# Patient Record
Sex: Female | Born: 1980 | Race: White | Hispanic: No | Marital: Married | Smoking: Never smoker
Health system: Southern US, Community
[De-identification: ages and names within clinical notes are randomized; demographics above are authoritative.]

## PROBLEM LIST (undated history)

## (undated) ENCOUNTER — Inpatient Hospital Stay (HOSPITAL_COMMUNITY): Payer: Self-pay

## (undated) DIAGNOSIS — F419 Anxiety disorder, unspecified: Secondary | ICD-10-CM

## (undated) DIAGNOSIS — K649 Unspecified hemorrhoids: Secondary | ICD-10-CM

## (undated) HISTORY — PX: CHOLECYSTECTOMY: SHX55

---

## 1999-01-02 ENCOUNTER — Emergency Department (HOSPITAL_COMMUNITY): Admission: EM | Admit: 1999-01-02 | Discharge: 1999-01-02 | Payer: Self-pay | Admitting: Emergency Medicine

## 1999-01-02 ENCOUNTER — Encounter: Payer: Self-pay | Admitting: Emergency Medicine

## 1999-08-24 ENCOUNTER — Encounter: Payer: Self-pay | Admitting: Emergency Medicine

## 1999-08-24 ENCOUNTER — Emergency Department (HOSPITAL_COMMUNITY): Admission: EM | Admit: 1999-08-24 | Discharge: 1999-08-24 | Payer: Self-pay | Admitting: *Deleted

## 1999-09-28 ENCOUNTER — Encounter: Admission: RE | Admit: 1999-09-28 | Discharge: 1999-09-28 | Payer: Self-pay | Admitting: Internal Medicine

## 2002-03-05 ENCOUNTER — Other Ambulatory Visit: Admission: RE | Admit: 2002-03-05 | Discharge: 2002-03-05 | Payer: Self-pay | Admitting: Obstetrics and Gynecology

## 2003-05-08 ENCOUNTER — Other Ambulatory Visit: Admission: RE | Admit: 2003-05-08 | Discharge: 2003-05-08 | Payer: Self-pay | Admitting: Obstetrics and Gynecology

## 2004-05-11 ENCOUNTER — Other Ambulatory Visit: Admission: RE | Admit: 2004-05-11 | Discharge: 2004-05-11 | Payer: Self-pay | Admitting: Obstetrics and Gynecology

## 2005-05-18 ENCOUNTER — Other Ambulatory Visit: Admission: RE | Admit: 2005-05-18 | Discharge: 2005-05-18 | Payer: Self-pay | Admitting: Obstetrics and Gynecology

## 2006-01-06 ENCOUNTER — Ambulatory Visit: Payer: Self-pay | Admitting: Internal Medicine

## 2006-01-26 ENCOUNTER — Ambulatory Visit: Payer: Self-pay | Admitting: Internal Medicine

## 2006-01-31 ENCOUNTER — Ambulatory Visit: Payer: Self-pay | Admitting: Internal Medicine

## 2006-02-17 ENCOUNTER — Ambulatory Visit: Payer: Self-pay | Admitting: Surgery

## 2006-04-04 ENCOUNTER — Emergency Department: Payer: Self-pay | Admitting: Emergency Medicine

## 2006-04-05 ENCOUNTER — Other Ambulatory Visit: Payer: Self-pay

## 2006-12-15 ENCOUNTER — Ambulatory Visit: Payer: Self-pay | Admitting: Gastroenterology

## 2013-05-16 LAB — OB RESULTS CONSOLE RUBELLA ANTIBODY, IGM: Rubella: IMMUNE

## 2013-05-16 LAB — OB RESULTS CONSOLE HEPATITIS B SURFACE ANTIGEN: Hepatitis B Surface Ag: NEGATIVE

## 2013-05-16 LAB — OB RESULTS CONSOLE HIV ANTIBODY (ROUTINE TESTING): HIV: NONREACTIVE

## 2013-05-16 LAB — OB RESULTS CONSOLE RPR: RPR: NONREACTIVE

## 2013-05-16 LAB — OB RESULTS CONSOLE ANTIBODY SCREEN: Antibody Screen: NEGATIVE

## 2013-05-16 LAB — OB RESULTS CONSOLE ABO/RH: RH Type: POSITIVE

## 2013-05-24 LAB — OB RESULTS CONSOLE GC/CHLAMYDIA
Chlamydia: NEGATIVE
Gonorrhea: NEGATIVE

## 2013-11-05 ENCOUNTER — Inpatient Hospital Stay (HOSPITAL_COMMUNITY)
Admission: AD | Admit: 2013-11-05 | Discharge: 2013-11-05 | Disposition: A | Discharge status: Home / Self Care | Payer: BC Managed Care – PPO | Source: Ambulatory Visit | Attending: Obstetrics and Gynecology | Admitting: Obstetrics and Gynecology

## 2013-11-05 ENCOUNTER — Encounter (HOSPITAL_COMMUNITY): Payer: Self-pay

## 2013-11-05 DIAGNOSIS — R109 Unspecified abdominal pain: Secondary | ICD-10-CM | POA: Insufficient documentation

## 2013-11-05 DIAGNOSIS — O4700 False labor before 37 completed weeks of gestation, unspecified trimester: Secondary | ICD-10-CM | POA: Insufficient documentation

## 2013-11-05 HISTORY — DX: Anxiety disorder, unspecified: F41.9

## 2013-11-05 LAB — URINALYSIS, ROUTINE W REFLEX MICROSCOPIC
Bilirubin Urine: NEGATIVE
Glucose, UA: NEGATIVE mg/dL
Hgb urine dipstick: NEGATIVE
Ketones, ur: NEGATIVE mg/dL
Leukocytes, UA: NEGATIVE
Nitrite: NEGATIVE
Protein, ur: NEGATIVE mg/dL
Specific Gravity, Urine: 1.01 (ref 1.005–1.030)
Urobilinogen, UA: 0.2 mg/dL (ref 0.0–1.0)
pH: 6 (ref 5.0–8.0)

## 2013-11-05 LAB — FETAL FIBRONECTIN: Fetal Fibronectin: NEGATIVE

## 2013-11-05 MED ORDER — LACTATED RINGERS IV SOLN: INTRAVENOUS | Status: AC

## 2013-11-05 MED ORDER — LACTATED RINGERS IV BOLUS (SEPSIS)
500.0000 mL | Freq: Once | INTRAVENOUS | Status: AC
  Administered 2013-11-05: 500 mL via INTRAVENOUS

## 2013-11-05 MED ORDER — TERBUTALINE SULFATE 1 MG/ML IJ SOLN
0.2500 mg | Freq: Once | INTRAMUSCULAR | Status: AC
  Administered 2013-11-05: 0.25 mg via SUBCUTANEOUS
  Filled 2013-11-05: qty 1

## 2013-11-05 NOTE — MAU Note (Signed)
Pt states that she felt more tightness in her stomach and more pressure. Denies LOF, vag bleeding or discharge. +FM.

## 2013-11-05 NOTE — MAU Provider Note (Signed)
  History     CSN: [id number]  Arrival date and time: 11/05/13 2011   None     Chief Complaint  Patient presents with  . Contractions   HPI  Cynthia [name] is 32 y.o. G1P0 at [redacted]w[redacted]d who presents today with tightness and lower abdominal pressure. She denies VB or LOF and confirms fetal movement. She is unsure if she is having any contractions. She states that this started this morning.   Past Medical History  Diagnosis Date  . Anxiety     Past Surgical History  Procedure Laterality Date  . Cholecystectomy      History reviewed. No pertinent family history.  History  Substance Use Topics  . Smoking status: Never Smoker   . Smokeless tobacco: Never Used  . Alcohol Use: No    Allergies:  Allergies  Allergen Reactions  . Avelox [Moxifloxacin] Other (See Comments)    Pt felt faint and passed out    Prescriptions prior to admission  Medication Sig Dispense Refill  . docusate sodium (COLACE) 100 MG capsule Take 100 mg by mouth 2 (two) times daily.      . Prenatal Vit-Fe Fumarate-FA (MULTIVITAMIN-PRENATAL) 27-0.8 MG TABS tablet Take 1 tablet by mouth daily at 12 noon.        ROS Physical Exam   Blood pressure 128/88, pulse 116, temperature 98.6 F (37 C), temperature source Oral, resp. rate 18, height 5' 3.25" (1.607 m), weight 145 lb (65.772 kg), SpO2 96.00%.  Physical Exam  Nursing note and vitals reviewed. Constitutional: She is oriented to person, place, and time. She appears well-developed and well-nourished. No distress.  Cardiovascular: Normal rate.   Respiratory: Effort normal.  GI: Soft. There is no tenderness.  Genitourinary:   External: no lesion Vagina: small amount of white discharge Cervix: pink, smooth,  Uterus: AGA    Neurological: She is alert and oriented to person, place, and time.  Skin: Skin is warm.  Psychiatric: She has a normal mood and affect.   FHT: 145, moderate with accels, no decels Toco: irregular contractions.  MAU  Course  Procedures  2100: Dr. Henderson Cloud here to see the patient, care assumed.   Assessment and Plan    Cynthia [name] 11/05/2013, 8:59 PM

## 2013-11-28 LAB — OB RESULTS CONSOLE GBS: STREP GROUP B AG: POSITIVE

## 2013-12-03 ENCOUNTER — Inpatient Hospital Stay (HOSPITAL_COMMUNITY)
Admission: AD | Admit: 2013-12-03 | Discharge: 2013-12-03 | Disposition: A | Discharge status: Home / Self Care | Payer: BC Managed Care – PPO | Source: Ambulatory Visit | Attending: Obstetrics and Gynecology | Admitting: Obstetrics and Gynecology

## 2013-12-03 ENCOUNTER — Encounter (HOSPITAL_COMMUNITY): Payer: Self-pay | Admitting: *Deleted

## 2013-12-03 DIAGNOSIS — O479 False labor, unspecified: Secondary | ICD-10-CM | POA: Insufficient documentation

## 2013-12-03 NOTE — MAU Note (Deleted)
C/o L flank pain that radiates to the R since yesterday around 1000;

## 2013-12-03 NOTE — MAU Note (Signed)
C/o ucs since 1000 this AM;

## 2013-12-20 ENCOUNTER — Encounter (HOSPITAL_COMMUNITY): Payer: Self-pay

## 2013-12-20 ENCOUNTER — Encounter (HOSPITAL_COMMUNITY)
Admission: AD | Disposition: A | Discharge status: Home / Self Care | Payer: Self-pay | Source: Ambulatory Visit | Attending: Obstetrics & Gynecology

## 2013-12-20 ENCOUNTER — Inpatient Hospital Stay (HOSPITAL_COMMUNITY)
Admission: AD | Admit: 2013-12-20 | Discharge: 2013-12-23 | DRG: 765 | Disposition: A | Discharge status: Home / Self Care | Payer: BC Managed Care – PPO | Source: Ambulatory Visit | Attending: Obstetrics & Gynecology | Admitting: Obstetrics & Gynecology

## 2013-12-20 ENCOUNTER — Encounter (HOSPITAL_COMMUNITY): Payer: BC Managed Care – PPO | Admitting: Anesthesiology

## 2013-12-20 ENCOUNTER — Inpatient Hospital Stay (HOSPITAL_COMMUNITY): Payer: BC Managed Care – PPO | Admitting: Anesthesiology

## 2013-12-20 DIAGNOSIS — Z22330 Carrier of Group B streptococcus: Secondary | ICD-10-CM

## 2013-12-20 DIAGNOSIS — O99892 Other specified diseases and conditions complicating childbirth: Secondary | ICD-10-CM | POA: Diagnosis present

## 2013-12-20 DIAGNOSIS — Z98891 History of uterine scar from previous surgery: Secondary | ICD-10-CM

## 2013-12-20 DIAGNOSIS — O1002 Pre-existing essential hypertension complicating childbirth: Secondary | ICD-10-CM | POA: Diagnosis present

## 2013-12-20 DIAGNOSIS — O9989 Other specified diseases and conditions complicating pregnancy, childbirth and the puerperium: Secondary | ICD-10-CM

## 2013-12-20 DIAGNOSIS — O8612 Endometritis following delivery: Secondary | ICD-10-CM | POA: Diagnosis not present

## 2013-12-20 HISTORY — DX: Unspecified hemorrhoids: K64.9

## 2013-12-20 LAB — CBC
HCT: 42.1 % (ref 36.0–46.0)
Hemoglobin: 15 g/dL (ref 12.0–15.0)
MCH: 31.8 pg (ref 26.0–34.0)
MCHC: 35.6 g/dL (ref 30.0–36.0)
MCV: 89.2 fL (ref 78.0–100.0)
Platelets: 242 10*3/uL (ref 150–400)
RBC: 4.72 MIL/uL (ref 3.87–5.11)
RDW: 12.8 % (ref 11.5–15.5)
WBC: 14 10*3/uL — ABNORMAL HIGH (ref 4.0–10.5)

## 2013-12-20 LAB — RPR: RPR: NONREACTIVE

## 2013-12-20 SURGERY — Surgical Case
Anesthesia: Epidural | Site: Abdomen

## 2013-12-20 MED ORDER — NALOXONE HCL 1 MG/ML IJ SOLN: 1.0000 ug/kg/h | INTRAVENOUS | Status: DC | PRN

## 2013-12-20 MED ORDER — LIDOCAINE HCL (PF) 1 % IJ SOLN
INTRAMUSCULAR | Status: DC | PRN
  Administered 2013-12-20 (×2): 5 mL

## 2013-12-20 MED ORDER — IBUPROFEN 600 MG PO TABS
600.0000 mg | ORAL_TABLET | Freq: Four times a day (QID) | ORAL | Status: DC
  Administered 2013-12-21 – 2013-12-23 (×11): 600 mg via ORAL
  Filled 2013-12-20 (×11): qty 1

## 2013-12-20 MED ORDER — LACTATED RINGERS IV SOLN
INTRAVENOUS | Status: DC
  Administered 2013-12-20 (×3): via INTRAVENOUS

## 2013-12-20 MED ORDER — LACTATED RINGERS IV SOLN
500.0000 mL | INTRAVENOUS | Status: DC | PRN
  Administered 2013-12-20 (×2): via INTRAVENOUS

## 2013-12-20 MED ORDER — SODIUM BICARBONATE 8.4 % IV SOLN
INTRAVENOUS | Status: DC | PRN
  Administered 2013-12-20 (×4): 5 mL via EPIDURAL

## 2013-12-20 MED ORDER — FLEET ENEMA 7-19 GM/118ML RE ENEM: 1.0000 | ENEMA | Freq: Once | RECTAL | Status: DC

## 2013-12-20 MED ORDER — FENTANYL CITRATE 0.05 MG/ML IJ SOLN: 25.0000 ug | INTRAMUSCULAR | Status: DC | PRN

## 2013-12-20 MED ORDER — TETANUS-DIPHTH-ACELL PERTUSSIS 5-2.5-18.5 LF-MCG/0.5 IM SUSP: 0.5000 mL | Freq: Once | INTRAMUSCULAR | Status: DC

## 2013-12-20 MED ORDER — PHENYLEPHRINE HCL 10 MG/ML IJ SOLN
INTRAMUSCULAR | Status: DC | PRN
  Administered 2013-12-20 (×3): 80 ug via INTRAVENOUS
  Administered 2013-12-20: 40 ug via INTRAVENOUS

## 2013-12-20 MED ORDER — NALBUPHINE HCL 10 MG/ML IJ SOLN: 5.0000 mg | INTRAMUSCULAR | Status: DC | PRN

## 2013-12-20 MED ORDER — WITCH HAZEL-GLYCERIN EX PADS
1.0000 | MEDICATED_PAD | CUTANEOUS | Status: DC | PRN
Start: 2013-12-20 — End: 2013-12-23

## 2013-12-20 MED ORDER — ONDANSETRON HCL 4 MG/2ML IJ SOLN: 4.0000 mg | Freq: Four times a day (QID) | INTRAMUSCULAR | Status: DC | PRN

## 2013-12-20 MED ORDER — DIPHENHYDRAMINE HCL 25 MG PO CAPS
25.0000 mg | ORAL_CAPSULE | ORAL | Status: DC | PRN
  Filled 2013-12-20: qty 1

## 2013-12-20 MED ORDER — LACTATED RINGERS IV SOLN
500.0000 mL | Freq: Once | INTRAVENOUS | Status: AC
  Administered 2013-12-20: 500 mL via INTRAVENOUS

## 2013-12-20 MED ORDER — ONDANSETRON HCL 4 MG/2ML IJ SOLN
INTRAMUSCULAR | Status: AC
  Filled 2013-12-20: qty 2

## 2013-12-20 MED ORDER — SIMETHICONE 80 MG PO CHEW
80.0000 mg | CHEWABLE_TABLET | Freq: Three times a day (TID) | ORAL | Status: DC
  Administered 2013-12-21 – 2013-12-23 (×7): 80 mg via ORAL
  Filled 2013-12-20 (×7): qty 1

## 2013-12-20 MED ORDER — MEPERIDINE HCL 25 MG/ML IJ SOLN: 6.2500 mg | INTRAMUSCULAR | Status: DC | PRN

## 2013-12-20 MED ORDER — SENNOSIDES-DOCUSATE SODIUM 8.6-50 MG PO TABS
2.0000 | ORAL_TABLET | ORAL | Status: DC
  Administered 2013-12-21 – 2013-12-23 (×3): 2 via ORAL
  Filled 2013-12-20 (×3): qty 2

## 2013-12-20 MED ORDER — LANOLIN HYDROUS EX OINT: 1.0000 "application " | TOPICAL_OINTMENT | CUTANEOUS | Status: DC | PRN

## 2013-12-20 MED ORDER — KETOROLAC TROMETHAMINE 30 MG/ML IJ SOLN: 30.0000 mg | Freq: Four times a day (QID) | INTRAMUSCULAR | Status: AC | PRN

## 2013-12-20 MED ORDER — LIDOCAINE HCL (PF) 1 % IJ SOLN
30.0000 mL | INTRAMUSCULAR | Status: DC | PRN
  Filled 2013-12-20: qty 30

## 2013-12-20 MED ORDER — CEFAZOLIN SODIUM-DEXTROSE 2-3 GM-% IV SOLR
2.0000 g | Freq: Three times a day (TID) | INTRAVENOUS | Status: DC
  Administered 2013-12-20: 2 g via INTRAVENOUS
  Filled 2013-12-20 (×2): qty 50

## 2013-12-20 MED ORDER — ONDANSETRON HCL 4 MG/2ML IJ SOLN: 4.0000 mg | INTRAMUSCULAR | Status: DC | PRN

## 2013-12-20 MED ORDER — FENTANYL 2.5 MCG/ML BUPIVACAINE 1/10 % EPIDURAL INFUSION (WH - ANES)
14.0000 mL/h | INTRAMUSCULAR | Status: DC | PRN
  Administered 2013-12-20: 14 mL/h via EPIDURAL
  Filled 2013-12-20: qty 125

## 2013-12-20 MED ORDER — MORPHINE SULFATE 0.5 MG/ML IJ SOLN
INTRAMUSCULAR | Status: AC
  Filled 2013-12-20: qty 10

## 2013-12-20 MED ORDER — DIPHENHYDRAMINE HCL 50 MG/ML IJ SOLN: 25.0000 mg | INTRAMUSCULAR | Status: DC | PRN

## 2013-12-20 MED ORDER — DEXTROSE 5 % IV SOLN
2.5000 10*6.[IU] | INTRAVENOUS | Status: DC
  Administered 2013-12-20: 2.5 10*6.[IU] via INTRAVENOUS
  Filled 2013-12-20 (×5): qty 2.5

## 2013-12-20 MED ORDER — DIPHENHYDRAMINE HCL 50 MG/ML IJ SOLN: 12.5000 mg | INTRAMUSCULAR | Status: DC | PRN

## 2013-12-20 MED ORDER — MORPHINE SULFATE (PF) 0.5 MG/ML IJ SOLN
INTRAMUSCULAR | Status: DC | PRN
  Administered 2013-12-20: 4 mg via EPIDURAL

## 2013-12-20 MED ORDER — OXYCODONE-ACETAMINOPHEN 5-325 MG PO TABS
1.0000 | ORAL_TABLET | ORAL | Status: DC | PRN
  Administered 2013-12-21: 1 via ORAL
  Filled 2013-12-20: qty 1

## 2013-12-20 MED ORDER — MEPERIDINE HCL 25 MG/ML IJ SOLN
6.2500 mg | INTRAMUSCULAR | Status: DC | PRN
  Administered 2013-12-20: 6.25 mg via INTRAVENOUS

## 2013-12-20 MED ORDER — METOCLOPRAMIDE HCL 5 MG/ML IJ SOLN: 10.0000 mg | Freq: Once | INTRAMUSCULAR | Status: DC | PRN

## 2013-12-20 MED ORDER — MENTHOL 3 MG MT LOZG: 1.0000 | LOZENGE | OROMUCOSAL | Status: DC | PRN

## 2013-12-20 MED ORDER — CITRIC ACID-SODIUM CITRATE 334-500 MG/5ML PO SOLN
30.0000 mL | ORAL | Status: DC | PRN
  Administered 2013-12-20: 30 mL via ORAL
  Filled 2013-12-20: qty 15

## 2013-12-20 MED ORDER — ACETAMINOPHEN 325 MG PO TABS: 650.0000 mg | ORAL_TABLET | ORAL | Status: DC | PRN

## 2013-12-20 MED ORDER — OXYTOCIN BOLUS FROM INFUSION
500.0000 mL | INTRAVENOUS | Status: DC
Start: 2013-12-20 — End: 2013-12-20

## 2013-12-20 MED ORDER — PRENATAL MULTIVITAMIN CH
1.0000 | ORAL_TABLET | Freq: Every day | ORAL | Status: DC
  Administered 2013-12-21 – 2013-12-23 (×3): 1 via ORAL
  Filled 2013-12-20 (×3): qty 1

## 2013-12-20 MED ORDER — GENTAMICIN SULFATE 40 MG/ML IJ SOLN: 2.0000 mg/kg | Freq: Two times a day (BID) | INTRAVENOUS | Status: DC

## 2013-12-20 MED ORDER — CLINDAMYCIN PHOSPHATE 900 MG/50ML IV SOLN: 900.0000 mg | Freq: Three times a day (TID) | INTRAVENOUS | Status: DC

## 2013-12-20 MED ORDER — DIBUCAINE 1 % RE OINT: 1.0000 "application " | TOPICAL_OINTMENT | RECTAL | Status: DC | PRN

## 2013-12-20 MED ORDER — IBUPROFEN 600 MG PO TABS: 600.0000 mg | ORAL_TABLET | Freq: Four times a day (QID) | ORAL | Status: DC | PRN

## 2013-12-20 MED ORDER — METOCLOPRAMIDE HCL 5 MG/ML IJ SOLN: 10.0000 mg | Freq: Three times a day (TID) | INTRAMUSCULAR | Status: DC | PRN

## 2013-12-20 MED ORDER — EPHEDRINE 5 MG/ML INJ
10.0000 mg | INTRAVENOUS | Status: DC | PRN
  Filled 2013-12-20: qty 4
  Filled 2013-12-20: qty 2

## 2013-12-20 MED ORDER — ZOLPIDEM TARTRATE 5 MG PO TABS: 5.0000 mg | ORAL_TABLET | Freq: Every evening | ORAL | Status: DC | PRN

## 2013-12-20 MED ORDER — PENICILLIN G POTASSIUM 5000000 UNITS IJ SOLR
5.0000 10*6.[IU] | Freq: Once | INTRAVENOUS | Status: AC
  Administered 2013-12-20: 5 10*6.[IU] via INTRAVENOUS
  Filled 2013-12-20: qty 5

## 2013-12-20 MED ORDER — OXYTOCIN 10 UNIT/ML IJ SOLN
INTRAMUSCULAR | Status: AC
  Filled 2013-12-20: qty 4

## 2013-12-20 MED ORDER — NALOXONE HCL 0.4 MG/ML IJ SOLN: 0.4000 mg | INTRAMUSCULAR | Status: DC | PRN

## 2013-12-20 MED ORDER — SIMETHICONE 80 MG PO CHEW
80.0000 mg | CHEWABLE_TABLET | ORAL | Status: DC
  Administered 2013-12-21: 80 mg via ORAL
  Filled 2013-12-20 (×3): qty 1

## 2013-12-20 MED ORDER — EPHEDRINE 5 MG/ML INJ
10.0000 mg | INTRAVENOUS | Status: DC | PRN
  Filled 2013-12-20: qty 2

## 2013-12-20 MED ORDER — PHENYLEPHRINE 40 MCG/ML (10ML) SYRINGE FOR IV PUSH (FOR BLOOD PRESSURE SUPPORT)
80.0000 ug | PREFILLED_SYRINGE | INTRAVENOUS | Status: DC | PRN
  Filled 2013-12-20: qty 2
  Filled 2013-12-20: qty 10

## 2013-12-20 MED ORDER — OXYTOCIN 40 UNITS IN LACTATED RINGERS INFUSION - SIMPLE MED: 62.5000 mL/h | INTRAVENOUS | Status: AC

## 2013-12-20 MED ORDER — SCOPOLAMINE 1 MG/3DAYS TD PT72
1.0000 | MEDICATED_PATCH | Freq: Once | TRANSDERMAL | Status: DC
  Administered 2013-12-20: 1.5 mg via TRANSDERMAL

## 2013-12-20 MED ORDER — GENTAMICIN SULFATE 40 MG/ML IJ SOLN: 2.0000 mg/kg | Freq: Three times a day (TID) | INTRAVENOUS | Status: DC

## 2013-12-20 MED ORDER — ONDANSETRON HCL 4 MG PO TABS: 4.0000 mg | ORAL_TABLET | ORAL | Status: DC | PRN

## 2013-12-20 MED ORDER — SODIUM BICARBONATE 8.4 % IV SOLN
INTRAVENOUS | Status: AC
  Filled 2013-12-20: qty 50

## 2013-12-20 MED ORDER — SODIUM CHLORIDE 0.9 % IJ SOLN: 3.0000 mL | INTRAMUSCULAR | Status: DC | PRN

## 2013-12-20 MED ORDER — OXYCODONE-ACETAMINOPHEN 5-325 MG PO TABS: 1.0000 | ORAL_TABLET | ORAL | Status: DC | PRN

## 2013-12-20 MED ORDER — ONDANSETRON HCL 4 MG/2ML IJ SOLN
INTRAMUSCULAR | Status: DC | PRN
  Administered 2013-12-20: 4 mg via INTRAVENOUS

## 2013-12-20 MED ORDER — SIMETHICONE 80 MG PO CHEW
80.0000 mg | CHEWABLE_TABLET | ORAL | Status: DC | PRN
  Administered 2013-12-22 – 2013-12-23 (×2): 80 mg via ORAL

## 2013-12-20 MED ORDER — TERBUTALINE SULFATE 1 MG/ML IJ SOLN
INTRAMUSCULAR | Status: AC
  Administered 2013-12-20: 0.25 mg
  Filled 2013-12-20: qty 1

## 2013-12-20 MED ORDER — KETOROLAC TROMETHAMINE 60 MG/2ML IM SOLN
60.0000 mg | Freq: Once | INTRAMUSCULAR | Status: AC | PRN
  Administered 2013-12-20: 30 mg via INTRAMUSCULAR

## 2013-12-20 MED ORDER — DIPHENHYDRAMINE HCL 50 MG/ML IJ SOLN
12.5000 mg | INTRAMUSCULAR | Status: DC | PRN
Start: 2013-12-20 — End: 2013-12-23

## 2013-12-20 MED ORDER — LACTATED RINGERS IV SOLN
INTRAVENOUS | Status: DC
  Administered 2013-12-21: 02:00:00 via INTRAVENOUS

## 2013-12-20 MED ORDER — PHENYLEPHRINE 40 MCG/ML (10ML) SYRINGE FOR IV PUSH (FOR BLOOD PRESSURE SUPPORT)
80.0000 ug | PREFILLED_SYRINGE | INTRAVENOUS | Status: DC | PRN
  Filled 2013-12-20: qty 2

## 2013-12-20 MED ORDER — LIDOCAINE-EPINEPHRINE (PF) 2 %-1:200000 IJ SOLN
INTRAMUSCULAR | Status: AC
  Filled 2013-12-20: qty 20

## 2013-12-20 MED ORDER — GENTAMICIN SULFATE 40 MG/ML IJ SOLN
Freq: Three times a day (TID) | INTRAVENOUS | Status: DC
  Administered 2013-12-20 – 2013-12-22 (×5): via INTRAVENOUS
  Filled 2013-12-20 (×6): qty 3.5

## 2013-12-20 MED ORDER — MEPERIDINE HCL 25 MG/ML IJ SOLN
INTRAMUSCULAR | Status: AC
  Filled 2013-12-20: qty 1

## 2013-12-20 MED ORDER — ONDANSETRON HCL 4 MG/2ML IJ SOLN: 4.0000 mg | Freq: Three times a day (TID) | INTRAMUSCULAR | Status: DC | PRN

## 2013-12-20 MED ORDER — OXYTOCIN 10 UNIT/ML IJ SOLN
40.0000 [IU] | INTRAVENOUS | Status: DC | PRN
  Administered 2013-12-20: 40 [IU] via INTRAVENOUS

## 2013-12-20 MED ORDER — OXYTOCIN 40 UNITS IN LACTATED RINGERS INFUSION - SIMPLE MED
62.5000 mL/h | INTRAVENOUS | Status: DC
Start: 2013-12-20 — End: 2013-12-20
  Filled 2013-12-20: qty 1000

## 2013-12-20 MED ORDER — PHENYLEPHRINE 40 MCG/ML (10ML) SYRINGE FOR IV PUSH (FOR BLOOD PRESSURE SUPPORT)
PREFILLED_SYRINGE | INTRAVENOUS | Status: AC
  Filled 2013-12-20: qty 10

## 2013-12-20 SURGICAL SUPPLY — 35 items
ADH SKN CLS APL DERMABOND .7 (GAUZE/BANDAGES/DRESSINGS)
APL SKNCLS STERI-STRIP NONHPOA (GAUZE/BANDAGES/DRESSINGS) ×1
BENZOIN TINCTURE PRP APPL 2/3 (GAUZE/BANDAGES/DRESSINGS) ×1 IMPLANT
CLAMP CORD UMBIL (MISCELLANEOUS) IMPLANT
CLOTH BEACON ORANGE TIMEOUT ST (SAFETY) ×2 IMPLANT
DERMABOND ADVANCED (GAUZE/BANDAGES/DRESSINGS)
DERMABOND ADVANCED .7 DNX12 (GAUZE/BANDAGES/DRESSINGS) IMPLANT
DRAPE LG THREE QUARTER DISP (DRAPES) IMPLANT
DRSG OPSITE POSTOP 4X10 (GAUZE/BANDAGES/DRESSINGS) ×2 IMPLANT
DURAPREP 26ML APPLICATOR (WOUND CARE) ×2 IMPLANT
ELECT REM PT RETURN 9FT ADLT (ELECTROSURGICAL) ×2
ELECTRODE REM PT RTRN 9FT ADLT (ELECTROSURGICAL) ×1 IMPLANT
EXTRACTOR VACUUM KIWI (MISCELLANEOUS) IMPLANT
EXTRACTOR VACUUM M CUP 4 TUBE (SUCTIONS) IMPLANT
GLOVE BIO SURGEON STRL SZ 6 (GLOVE) ×2 IMPLANT
GLOVE BIOGEL PI IND STRL 6 (GLOVE) ×2 IMPLANT
GLOVE BIOGEL PI INDICATOR 6 (GLOVE) ×2
GOWN STRL REIN XL XLG (GOWN DISPOSABLE) ×4 IMPLANT
KIT ABG SYR 3ML LUER SLIP (SYRINGE) ×2 IMPLANT
NDL HYPO 25X5/8 SAFETYGLIDE (NEEDLE) ×1 IMPLANT
NEEDLE HYPO 25X5/8 SAFETYGLIDE (NEEDLE) ×2 IMPLANT
NS IRRIG 1000ML POUR BTL (IV SOLUTION) ×2 IMPLANT
PACK C SECTION WH (CUSTOM PROCEDURE TRAY) ×2 IMPLANT
PAD OB MATERNITY 4.3X12.25 (PERSONAL CARE ITEMS) ×2 IMPLANT
STAPLER VISISTAT 35W (STAPLE) IMPLANT
STRIP CLOSURE SKIN 1/2X4 (GAUZE/BANDAGES/DRESSINGS) ×1 IMPLANT
SUT CHROMIC 0 CTX 36 (SUTURE) ×6 IMPLANT
SUT MON AB 2-0 CT1 27 (SUTURE) ×2 IMPLANT
SUT PDS AB 0 CT1 27 (SUTURE) IMPLANT
SUT PLAIN 0 NONE (SUTURE) IMPLANT
SUT VIC AB 0 CT1 36 (SUTURE) ×1 IMPLANT
SUT VIC AB 4-0 KS 27 (SUTURE) IMPLANT
TOWEL OR 17X24 6PK STRL BLUE (TOWEL DISPOSABLE) ×2 IMPLANT
TRAY FOLEY CATH 14FR (SET/KITS/TRAYS/PACK) IMPLANT
WATER STERILE IRR 1000ML POUR (IV SOLUTION) ×2 IMPLANT

## 2013-12-20 NOTE — Transfer of Care (Signed)
Immediate Anesthesia Transfer of Care Note  Patient: Cynthia [name]  Procedure(s) Performed: Procedure(s): CESAREAN SECTION (N/A)  Patient Location: PACU  Anesthesia Type:Epidural  Level of Consciousness: awake  Airway & Oxygen Therapy: Patient Spontanous Breathing  Post-op Assessment: Report given to PACU RN  Post vital signs: Reviewed and stable  Complications: No apparent anesthesia complications

## 2013-12-20 NOTE — Anesthesia Preprocedure Evaluation (Addendum)
Anesthesia Evaluation  Patient identified by MRN, date of birth, ID band Patient awake    Reviewed: Allergy & Precautions, H&P , Patient's Chart, lab work & pertinent test results  Airway Mallampati: II TM Distance: >3 FB Neck ROM: full    Dental   Pulmonary  breath sounds clear to auscultation        Cardiovascular Rhythm:regular Rate:Normal     Neuro/Psych    GI/Hepatic   Endo/Other    Renal/GU      Musculoskeletal   Abdominal   Peds  Hematology   Anesthesia Other Findings   Reproductive/Obstetrics (+) Pregnancy (nonreassurring fetal surveillance --> C/S)                          Anesthesia Physical Anesthesia Plan  ASA: II and emergent  Anesthesia Plan: Epidural   Post-op Pain Management:    Induction:   Airway Management Planned:   Additional Equipment:   Intra-op Plan:   Post-operative Plan:   Informed Consent: I have reviewed the patients History and Physical, chart, labs and discussed the procedure including the risks, benefits and alternatives for the proposed anesthesia with the patient or authorized representative who has indicated his/her understanding and acceptance.     Plan Discussed with: Surgeon and CRNA  Anesthesia Plan Comments:        Anesthesia Quick Evaluation

## 2013-12-20 NOTE — Consult Note (Signed)
Neonatology Note:   Attendance at C-section:    I was asked by Dr. Lynnette Caffey to attend this primary C/S at term due to fetal intolerance to labor. The mother is a G1P0 A pos, GBS pos (treated with 2 doses of Pen G prior to delivery) with anxiety. ROM 21 hours prior to delivery, fluid deeply meconium-stained at delivery. Infant floppy, pale, dusky, apneic, with a thin, deeply meconium-stained cord at birth. We bulb suctioned for several globs of thick, green mucous and ran a DeLee suction down the throat for another 1 ml of green fluid. The HR was about 60 and the baby remained apneic despite stimulation, so we applied PPV starting at 1 min 15 seconds of life. The HR came up quickly and color gradually improved. The baby cried at 3.5 minutes. Tone was normal by 5 minutes, perfusion was good, and the baby continued to cry well. Ap 1/9. Lungs clear to ausc in DR. To CN to care of Pediatrician.   Real Cons, MD

## 2013-12-20 NOTE — MAU Note (Signed)
Pt c/o uc since 0300 that are every 5 mins apart. States some lof, but not sure if water has broken. Denies vag bleeding. +FM

## 2013-12-20 NOTE — Progress Notes (Signed)
Patient counseled for C/S secondary to non-reassuring fetal heart tracing with variables with each CTX despite repositioning, bolus, and oxygen.  Patient counseled about bleeding, infection, damage to surrounding structure, and scarring.  Also, she is informed of this 1% risk of uterine rupture in future pregnancies.  All questions answered and she is ready to proceed.

## 2013-12-20 NOTE — H&P (Signed)
Cynthia [name] is a 33 y.o. female presenting for labor.  CTX started overnight with questionable leakage of fluid.  She presented to MAU when she definitely have a gush of fluid.  She denies VB and reports good FM.  Antepartum course complicated by questionable chronic hypertension (per pt anxiety related).  GBS positive.  Comfortable with epidural. Maternal Medical History:  Reason for admission: Rupture of membranes and contractions.   Contractions: Onset was 6-12 hours ago.   Frequency: regular.   Perceived severity is moderate.    Fetal activity: Perceived fetal activity is normal.   Last perceived fetal movement was within the past hour.    Prenatal complications: no prenatal complications Prenatal Complications - Diabetes: none.    OB History   Grav Para Term Preterm Abortions TAB SAB Ect Mult Living   1              Past Medical History  Diagnosis Date  . Anxiety   . Hemorrhoids    Past Surgical History  Procedure Laterality Date  . Cholecystectomy     Family History: family history includes Heart disease in her father. Social History:  reports that she has never smoked. She has never used smokeless tobacco. She reports that she does not drink alcohol or use illicit drugs.   Prenatal Transfer Tool  Maternal Diabetes: No Genetic Screening: Normal Maternal Ultrasounds/Referrals: Normal Fetal Ultrasounds or other Referrals:  None Maternal Substance Abuse:  No Significant Maternal Medications:  None Significant Maternal Lab Results:  Lab values include: Group B Strep positive Other Comments:  None  ROS  Dilation: 6 Effacement (%): 100 Station: -2 Exam by:: Cynthia [name] RNC Blood pressure 114/65, pulse 76, temperature 98.5 F (36.9 C), temperature source Oral, resp. rate 20, height 5\' 3"  (1.6 m), weight 150 lb (68.04 kg), SpO2 100.00%. Maternal Exam:  Uterine Assessment: Contraction strength is moderate.  Contraction frequency is regular.   Abdomen: Patient  reports no abdominal tenderness. Fundal height is c/w dates.   Estimated fetal weight is 7#8.       Physical Exam  Constitutional: She is oriented to person, place, and time. She appears well-developed and well-nourished.  GI: Soft. There is no rebound and no guarding.  Neurological: She is alert and oriented to person, place, and time.  Skin: Skin is warm and dry.  Psychiatric: She has a normal mood and affect. Her behavior is normal.    Prenatal labs: ABO, Rh: A/Positive/-- (05/29 0000) Antibody: Negative (05/29 0000) Rubella: Immune (05/29 0000) RPR: Nonreactive (05/29 0000)  HBsAg: Negative (05/29 0000)  HIV: Non-reactive (05/29 0000)  GBS: Positive (12/11 0000)   Assessment/Plan: 33yo G1 at [redacted]w[redacted]d with labor Labor progressing well.  Will add pitocin if needed Anticipate SVD    Cynthia [name] 12/20/2013, 9:50 AM

## 2013-12-20 NOTE — Op Note (Signed)
Cynthia [name] PROCEDURE DATE: 12/20/2013  PREOPERATIVE DIAGNOSIS: Intrauterine pregnancy at  [redacted]w[redacted]d weeks gestation with non-reassuring fetal heart tracing  POSTOPERATIVE DIAGNOSIS: The same  PROCEDURE:  Primary  Low Transverse Cesarean Section  SURGEON:  Dr. Linda Hedges  INDICATIONS: Cynthia [name] is a 33 y.o. G1P1001 at [redacted]w[redacted]d scheduled for cesarean section secondary non-reassuring fetal heart tracing.  The risks of cesarean section discussed with the patient included but were not limited to: bleeding which may require transfusion or reoperation; infection which may require antibiotics; injury to bowel, bladder, ureters or other surrounding organs; injury to the fetus; need for additional procedures including hysterectomy in the event of a life-threatening hemorrhage; placental abnormalities wth subsequent pregnancies, incisional problems, thromboembolic phenomenon and other postoperative/anesthesia complications. The patient concurred with the proposed plan, giving informed written consent for the procedure.    FINDINGS:  Viable female infant in cephalic presentation, APGAR 1,9:  Weight pending  Thick meconium stained amniotic fluid.  Intact placenta, three vessel cord.  Grossly normal uterus, ovaries and fallopian tubes. .   ANESTHESIA:    Epidural ESTIMATED BLOOD LOSS: 800 ml SPECIMENS: Placenta sent to L&D COMPLICATIONS: None immediate  PROCEDURE IN DETAIL:  The patient received intravenous antibiotics and had sequential compression devices applied to her lower extremities while in the preoperative area.  She was then taken to the operating room where epidural anesthesia was dosed up to surgical level and was found to be adequate. She was then placed in a dorsal supine position with a leftward tilt, and prepped and draped in a sterile manner.  A foley catheter was placed into her bladder and attached to constant gravity.  After an adequate timeout was performed, a Pfannenstiel skin incision  was made with scalpel and carried through to the underlying layer of fascia. The fascia was incised in the midline and this incision was extended bilaterally using the Mayo scissors. Kocher clamps were applied to the superior aspect of the fascial incision and the underlying rectus muscles were dissected off bluntly. A similar process was carried out on the inferior aspect of the fascial incision. The rectus muscles were separated in the midline bluntly and the peritoneum was entered bluntly.  A bladder flap was created sharply and developed bluntly.  It was protected behind the bladder blade.   A transverse hysterotomy was made with a scalpel and extended bilaterally bluntly. The bladder blade was then removed. The infant was successfully delivered, and cord was clamped and cut and infant was handed over to awaiting neonatology team. Uterine massage was then administered and the placenta delivered intact with three-vessel cord. The uterus was cleared of clot and debris.  The hysterotomy was closed with #1 Chromic.  A second imbricating suture of #1 Chromic was used to reinforce the incision and aid in hemostasis.  The peritoneum and rectus muscles were noted to be hemostatic.  The fascia was closed with 0-Vicryl in a running fashion with good restoration of anatomy.  The subcutaneus tissue was copiously irrigated.  The skin was 2-0 Vicryl in a subcuticular fashion.  Pt tolerated the procedure will.  All counts were correct x2.  Pt went to the recovery room in stable condition.

## 2013-12-20 NOTE — Anesthesia Procedure Notes (Signed)
Epidural Patient location during procedure: OB Start time: 12/20/2013 8:54 AM  Staffing Anesthesiologist: Rudean Curt Performed by: anesthesiologist   Preanesthetic Checklist Completed: patient identified, site marked, surgical consent, pre-op evaluation, timeout performed, IV checked, risks and benefits discussed and monitors and equipment checked  Epidural Patient position: sitting Prep: site prepped and draped and DuraPrep Patient monitoring: continuous pulse ox and blood pressure Approach: midline Injection technique: LOR air  Needle:  Needle type: Tuohy  Needle gauge: 17 G Needle length: 9 cm and 9 Needle insertion depth: 5 cm cm Catheter type: closed end flexible Catheter size: 19 Gauge Catheter at skin depth: 10 cm Test dose: negative  Assessment Events: blood not aspirated, injection not painful, no injection resistance, negative IV test and no paresthesia  Additional Notes Patient identified.  Risk benefits discussed including failed block, incomplete pain control, headache, nerve damage, paralysis, blood pressure changes, nausea, vomiting, reactions to medication both toxic or allergic, and postpartum back pain.  Patient expressed understanding and wished to proceed.  All questions were answered.  Sterile technique used throughout procedure and epidural site dressed with sterile barrier dressing. No paresthesia or other complications noted.The patient did not experience any signs of intravascular injection such as tinnitus or metallic taste in mouth nor signs of intrathecal spread such as rapid motor block. Please see nursing notes for vital signs.

## 2013-12-20 NOTE — Progress Notes (Signed)
Notified pt in MAU for labor eval. Cervix 2.5/100/-1, ruptured, lof began at 1830 yesterday pm, GBS +, orders to admit, PCN-G regimen, notified of variables w/u/c's, will change positions.

## 2013-12-20 NOTE — Anesthesia Postprocedure Evaluation (Signed)
  Anesthesia Post-op Note  Anesthesia Post Note  Patient: Cynthia [name]  Procedure(s) Performed: Procedure(s) (LRB): CESAREAN SECTION (N/A)  Anesthesia type: Epidural  Patient location: PACU  Post pain: Pain level controlled  Post assessment: Post-op Vital signs reviewed  Last Vitals:  Filed Vitals:   12/20/13 1830  BP: 127/76  Pulse: 80  Temp:   Resp: 16    Post vital signs: stable  Level of consciousness: awake  Complications: No apparent anesthesia complications

## 2013-12-20 NOTE — Progress Notes (Signed)
Dr. Lynnette Caffey at bedside, discussing plan of care for c/s for fetal intolerance to labor.  Pt understands and verbalizes and signed consent.  CHG, betadine nose swabs, SCD pumps applied, sodium citrate given.  Phone calls to OR, anesthesia complete.

## 2013-12-21 LAB — CBC
HCT: 33.1 % — ABNORMAL LOW (ref 36.0–46.0)
Hemoglobin: 11.4 g/dL — ABNORMAL LOW (ref 12.0–15.0)
MCH: 31.1 pg (ref 26.0–34.0)
MCHC: 34.4 g/dL (ref 30.0–36.0)
MCV: 90.2 fL (ref 78.0–100.0)
PLATELETS: 181 10*3/uL (ref 150–400)
RBC: 3.67 MIL/uL — ABNORMAL LOW (ref 3.87–5.11)
RDW: 13 % (ref 11.5–15.5)
WBC: 16.7 10*3/uL — AB (ref 4.0–10.5)

## 2013-12-21 NOTE — Anesthesia Postprocedure Evaluation (Signed)
  Anesthesia Post-op Note  Patient: Cynthia [name]  Procedure(s) Performed: Procedure(s): CESAREAN SECTION (N/A)  Patient Location: Mother/Baby  Anesthesia Type:Epidural  Level of Consciousness: awake, alert  and oriented  Airway and Oxygen Therapy: Patient Spontanous Breathing  Post-op Pain: none  Post-op Assessment: Post-op Vital signs reviewed, Patient's Cardiovascular Status Stable, No headache, No backache, No residual numbness and No residual motor weakness  Post-op Vital Signs: Reviewed and stable  Complications: No apparent anesthesia complications

## 2013-12-21 NOTE — Progress Notes (Signed)
Subjective: Postpartum Day 1: Cesarean Delivery Patient reports tolerating PO and no problems voiding.    Objective: Vital signs in last 24 hours: Temp:  [98.1 F (36.7 C)-101 F (38.3 C)] 99 F (37.2 C) (01/03 0930) Pulse Rate:  [71-102] 71 (01/03 0930) Resp:  [16-22] 18 (01/03 0930) BP: (85-163)/(59-92) 116/76 mmHg (01/03 0930) SpO2:  [94 %-97 %] 95 % (01/03 0930)  Physical Exam:  General: alert, cooperative and appears stated age Lochia: appropriate Uterine Fundus: firm Incision: healing well, no significant drainage, no dehiscence.  Honeycomb in place. DVT Evaluation: No evidence of DVT seen on physical exam. Negative Homan's sign. No cords or calf tenderness.   Recent Labs  12/20/13 0745 12/21/13 0600  HGB 15.0 11.4*  HCT 42.1 33.1*    Assessment/Plan: Status post Cesarean section. Doing well postoperatively.  Continue current care. Endometritis-Continue Gent/Clinda x AF for 24 hours.  Last fever 2215 1/2.  Cynthia [name], [place] 12/21/2013, 10:57 AM

## 2013-12-22 MED ORDER — INFLUENZA VAC SPLIT QUAD 0.5 ML IM SUSP
0.5000 mL | INTRAMUSCULAR | Status: AC
  Administered 2013-12-22: 0.5 mL via INTRAMUSCULAR

## 2013-12-22 NOTE — Progress Notes (Signed)
Subjective: Postpartum Day 2: Cesarean Delivery Patient reports tolerating PO and no problems voiding.  Wants circ prior to discharge.  Objective: Vital signs in last 24 hours: Temp:  [98.6 F (37 C)-99 F (37.2 C)] 98.6 F (37 C) (01/04 0530) Pulse Rate:  [71-82] 76 (01/04 0530) Resp:  [18] 18 (01/04 0530) BP: (116-125)/(76-82) 125/80 mmHg (01/04 0530) SpO2:  [95 %-96 %] 96 % (01/03 1240)  Physical Exam:  General: alert, cooperative and appears stated age Lochia: appropriate Uterine Fundus: firm Incision: healing well, no significant drainage, no dehiscence.  Honey comb dressing in place. DVT Evaluation: No evidence of DVT seen on physical exam. Negative Homan's sign. No cords or calf tenderness.   Recent Labs  12/20/13 0745 12/21/13 0600  HGB 15.0 11.4*  HCT 42.1 33.1*    Assessment/Plan: Status post Cesarean section. Doing well postoperatively.  Continue current care. Endometritis- >24 hours without fever.  Discontinue Gent/Clinda. Counseled for circ including risk of bleeding, infection and scarring.  All questions answered.    Cynthia [name], [place] 12/22/2013, 8:14 AM

## 2013-12-23 ENCOUNTER — Encounter (HOSPITAL_COMMUNITY): Payer: Self-pay | Admitting: Obstetrics & Gynecology

## 2013-12-23 LAB — COMPREHENSIVE METABOLIC PANEL
ALT: 17 U/L (ref 0–35)
AST: 23 U/L (ref 0–37)
Albumin: 2.3 g/dL — ABNORMAL LOW (ref 3.5–5.2)
Alkaline Phosphatase: 92 U/L (ref 39–117)
BUN: 11 mg/dL (ref 6–23)
CALCIUM: 9.2 mg/dL (ref 8.4–10.5)
CO2: 27 meq/L (ref 19–32)
Chloride: 101 mEq/L (ref 96–112)
Creatinine, Ser: 0.57 mg/dL (ref 0.50–1.10)
GFR calc non Af Amer: >90 mL/min (ref >90)
GLUCOSE: 81 mg/dL (ref 70–99)
POTASSIUM: 3.6 meq/L — AB (ref 3.7–5.3)
Sodium: 140 mEq/L (ref 137–147)
TOTAL PROTEIN: 5.8 g/dL — AB (ref 6.0–8.3)
Total Bilirubin: 0.4 mg/dL (ref 0.3–1.2)

## 2013-12-23 LAB — CBC
HCT: 33.9 % — ABNORMAL LOW (ref 36.0–46.0)
HEMOGLOBIN: 11.6 g/dL — AB (ref 12.0–15.0)
MCH: 31.4 pg (ref 26.0–34.0)
MCHC: 34.2 g/dL (ref 30.0–36.0)
MCV: 91.9 fL (ref 78.0–100.0)
PLATELETS: 214 10*3/uL (ref 150–400)
RBC: 3.69 MIL/uL — AB (ref 3.87–5.11)
RDW: 13.1 % (ref 11.5–15.5)
WBC: 8 10*3/uL (ref 4.0–10.5)

## 2013-12-23 MED ORDER — IBUPROFEN 600 MG PO TABS: 600.0000 mg | ORAL_TABLET | Freq: Four times a day (QID) | ORAL | Status: DC

## 2013-12-23 MED ORDER — OXYCODONE-ACETAMINOPHEN 5-325 MG PO TABS: 1.0000 | ORAL_TABLET | ORAL | Status: DC | PRN

## 2013-12-23 NOTE — Discharge Summary (Signed)
Obstetric Discharge Summary Reason for Admission: onset of labor Prenatal Procedures: ultrasound Intrapartum Procedures: cesarean: low cervical, transverse Postpartum Procedures: none Complications-Operative and Postpartum: none Hemoglobin  Date Value Range Status  12/21/2013 11.4* 12.0 - 15.0 [name]/dL Final     REPEATED TO VERIFY     DELTA CHECK NOTED     HCT  Date Value Range Status  12/21/2013 33.1* 36.0 - 46.0 % Final    Physical Exam:  General: alert and cooperative Lochia: appropriate Uterine Fundus: firm Incision: honeycomb dressing CDI DVT Evaluation: No evidence of DVT seen on physical exam. Negative Homan's sign. No cords or calf tenderness. No significant calf/ankle edema. DTR's 1+, no clonus  Discharge Diagnoses: Term Pregnancy-delivered  Discharge Information: Date: 12/23/2013 Activity: pelvic rest Diet: routine Medications: PNV, Ibuprofen and Percocet Condition: stable Instructions: refer to practice specific booklet Discharge to: home   Newborn Data: Live born female  Birth Weight: 8 lb 7.5 oz ([id number] [name]) APGAR: 1, 9  Home with mother.  Cynthia [name] 12/23/2013, 8:31 AM

## 2013-12-23 NOTE — Progress Notes (Signed)
Subjective: Postpartum Day 3: Cesarean Delivery Patient reports tolerating PO, + flatus, + BM and no problems voiding.  Denies HA, blurred vision, RUQ pain.  Objective: Vital signs in last 24 hours: Temp:  [98.3 F (36.8 C)-98.4 F (36.9 C)] 98.3 F (36.8 C) (01/05 0547) Pulse Rate:  [78-79] 79 (01/05 0547) Resp:  [18] 18 (01/05 0547) BP: (120-139)/(82-95) 139/95 mmHg (01/05 0547) SpO2:  [99 %] 99 % (01/05 0547)  Physical Exam:  General: alert and cooperative Lochia: appropriate Uterine Fundus: firm Incision: honeycomb dressing CDI DVT Evaluation: No evidence of DVT seen on physical exam. Negative Homan's sign. No cords or calf tenderness. No significant calf/ankle edema. DTR's 1+ , no clonus  Neg RUQ tenderness   Recent Labs  12/21/13 0600  HGB 11.4*  HCT 33.1*    Assessment/Plan: Status post Cesarean section. Postoperative course complicated by elevated BP  Check PI[facility] labs and discharge home with recheck in 3-4 days.  Juanell Saffo G 12/23/2013, 8:24 AM

## 2014-10-20 ENCOUNTER — Encounter (HOSPITAL_COMMUNITY): Payer: Self-pay | Admitting: Obstetrics & Gynecology

## 2015-04-15 ENCOUNTER — Inpatient Hospital Stay (HOSPITAL_COMMUNITY)
Admission: AD | Admit: 2015-04-15 | Discharge: 2015-04-15 | Disposition: A | Discharge status: Home / Self Care | Payer: BLUE CROSS/BLUE SHIELD | Source: Ambulatory Visit | Attending: Obstetrics & Gynecology | Admitting: Obstetrics & Gynecology

## 2015-04-15 ENCOUNTER — Encounter (HOSPITAL_COMMUNITY): Payer: Self-pay | Admitting: *Deleted

## 2015-04-15 DIAGNOSIS — R1032 Left lower quadrant pain: Secondary | ICD-10-CM | POA: Diagnosis not present

## 2015-04-15 DIAGNOSIS — O009 Ectopic pregnancy, unspecified: Secondary | ICD-10-CM | POA: Diagnosis not present

## 2015-04-15 LAB — CBC WITH DIFFERENTIAL/PLATELET
Basophils Absolute: 0 10*3/uL (ref 0.0–0.1)
Basophils Relative: 0 % (ref 0–1)
EOS PCT: 0 % (ref 0–5)
Eosinophils Absolute: 0 10*3/uL (ref 0.0–0.7)
HEMATOCRIT: 37 % (ref 36.0–46.0)
Hemoglobin: 13 g/dL (ref 12.0–15.0)
LYMPHS ABS: 1.1 10*3/uL (ref 0.7–4.0)
LYMPHS PCT: 13 % (ref 12–46)
MCH: 30.4 pg (ref 26.0–34.0)
MCHC: 35.1 g/dL (ref 30.0–36.0)
MCV: 86.4 fL (ref 78.0–100.0)
Monocytes Absolute: 0.5 10*3/uL (ref 0.1–1.0)
Monocytes Relative: 6 % (ref 3–12)
Neutro Abs: 6.8 10*3/uL (ref 1.7–7.7)
Neutrophils Relative %: 81 % — ABNORMAL HIGH (ref 43–77)
PLATELETS: 281 10*3/uL (ref 150–400)
RBC: 4.28 MIL/uL (ref 3.87–5.11)
RDW: 13.2 % (ref 11.5–15.5)
WBC: 8.5 10*3/uL (ref 4.0–10.5)

## 2015-04-15 LAB — HCG, QUANTITATIVE, PREGNANCY: hCG, Beta Chain, Quant, S: 5279 m[IU]/mL — ABNORMAL HIGH (ref <5)

## 2015-04-15 LAB — CREATININE, SERUM
CREATININE: 0.54 mg/dL (ref 0.50–1.10)
GFR calc Af Amer: >90 mL/min (ref >90)
GFR calc non Af Amer: >90 mL/min (ref >90)

## 2015-04-15 LAB — BUN: BUN: 10 mg/dL (ref 6–23)

## 2015-04-15 LAB — AST: AST: 19 U/L (ref 0–37)

## 2015-04-15 MED ORDER — METHOTREXATE INJECTION FOR WOMEN'S HOSPITAL
50.0000 mg/m2 | Freq: Once | INTRAMUSCULAR | Status: AC
  Administered 2015-04-15: 80 mg via INTRAMUSCULAR
  Filled 2015-04-15: qty 1.6

## 2015-04-15 NOTE — MAU Note (Signed)
Sent from office, was being followed.  Had some dk blood about a wk ago, none since.  Mild ache on LLQ.  HCG  Over 6000, empty uterus and mass on left side.

## 2015-04-15 NOTE — Discharge Instructions (Signed)
Return Saturday, April 30 and Tues, May 3 for repeat hormone level  Methotrexate Treatment for an Ectopic Pregnancy Methotrexate is a medicine that treats ectopic pregnancy by stopping the growth of the fertilized egg. It also helps your body absorb tissue from the egg. This takes between 2 weeks and 6 weeks. Most ectopic pregnancies can be successfully treated with methotrexate if they are detected early enough. LET [facility] CARE PROVIDER KNOW ABOUT:  Any allergies you have.  All medicines you are taking, including vitamins, herbs, eye drops, creams, and over-the-counter medicines.  Medical conditions you have. RISKS AND COMPLICATIONS Generally, this is a safe treatment. However, as with any treatment, problems can occur. Possible problems or side effects include:  Nausea.  Vomiting.  Diarrhea.  Abdominal cramping.  Mouth sores.  Increased vaginal bleeding or spotting.   Swelling or irritation of the lining of your lungs (pneumonitis).  Failed treatment and continuation of the pregnancy.   Liver damage.  Hair loss. There is still a risk of the ectopic pregnancy rupturing while using the methotrexate. BEFORE THE PROCEDURE Before you take the medicine:   Liver tests, kidney tests, and a complete blood test are performed.  Blood tests are performed to measure the pregnancy hormone levels and to determine your blood type.  If you are Rh-negative and the father is Rh-positive or his Rh type is not known, you will be given a Rho (D) immune globulin shot. PROCEDURE  There are two methods that your health care provider may use to prescribe methotrexate. One method involves a single dose or injection of the medicine. Another method involves a series of doses given through several injections.  AFTER THE PROCEDURE  You may have some abdominal cramping, vaginal bleeding, and fatigue in the first few days after taking methotrexate.  Blood tests will be taken for several  weeks to check the pregnancy hormone levels. The blood tests are performed until there is no more pregnancy hormone detected in the blood. Document Released: 11/29/2001 Document Revised: 04/21/2014 Document Reviewed: 09/23/2013 [facility] Patient Information 2015 [place], [place]. This information is not intended to replace advice given to you by your health care provider. Make sure you discuss any questions you have with your health care provider.

## 2015-04-18 ENCOUNTER — Inpatient Hospital Stay (HOSPITAL_COMMUNITY)
Admission: AD | Admit: 2015-04-18 | Discharge: 2015-04-18 | Disposition: A | Discharge status: Home / Self Care | Payer: BLUE CROSS/BLUE SHIELD | Source: Ambulatory Visit | Attending: Obstetrics and Gynecology | Admitting: Obstetrics and Gynecology

## 2015-04-18 DIAGNOSIS — O009 Ectopic pregnancy, unspecified: Secondary | ICD-10-CM | POA: Diagnosis not present

## 2015-04-18 LAB — HCG, QUANTITATIVE, PREGNANCY: HCG, BETA CHAIN, QUANT, S: 5330 m[IU]/mL — AB (ref <5)

## 2015-04-18 NOTE — MAU Note (Signed)
Dr Corinna Capra notified of pt's quant results. Pt is to return to MAU for repeat quant on tues may 3rd

## 2015-04-18 NOTE — MAU Note (Signed)
Dong ok, mo bleeding, just little pinchy pains, nothing bad.

## 2015-04-21 ENCOUNTER — Inpatient Hospital Stay (HOSPITAL_COMMUNITY)
Admission: AD | Admit: 2015-04-21 | Discharge: 2015-04-21 | Disposition: A | Discharge status: Home / Self Care | Payer: BLUE CROSS/BLUE SHIELD | Source: Ambulatory Visit | Attending: Obstetrics & Gynecology | Admitting: Obstetrics & Gynecology

## 2015-04-21 DIAGNOSIS — O009 Unspecified ectopic pregnancy without intrauterine pregnancy: Secondary | ICD-10-CM | POA: Diagnosis present

## 2015-04-21 DIAGNOSIS — O001 Tubal pregnancy: Secondary | ICD-10-CM | POA: Diagnosis present

## 2015-04-21 LAB — COMPREHENSIVE METABOLIC PANEL
ALT: 15 U/L (ref 14–54)
AST: 15 U/L (ref 15–41)
Albumin: 4.4 g/dL (ref 3.5–5.0)
Alkaline Phosphatase: 64 U/L (ref 38–126)
Anion gap: 8 (ref 5–15)
BUN: 12 mg/dL (ref 6–20)
CHLORIDE: 103 mmol/L (ref 101–111)
CO2: 25 mmol/L (ref 22–32)
CREATININE: 0.68 mg/dL (ref 0.44–1.00)
Calcium: 9.3 mg/dL (ref 8.9–10.3)
GFR calc Af Amer: >60 mL/min (ref >60)
Glucose, Bld: 97 mg/dL (ref 70–99)
Potassium: 3.6 mmol/L (ref 3.5–5.1)
Sodium: 136 mmol/L (ref 135–145)
Total Bilirubin: 0.3 mg/dL (ref 0.3–1.2)
Total Protein: 8 g/dL (ref 6.5–8.1)

## 2015-04-21 LAB — HCG, QUANTITATIVE, PREGNANCY: hCG, Beta Chain, Quant, S: 4424 m[IU]/mL — ABNORMAL HIGH (ref <5)

## 2015-04-21 NOTE — MAU Provider Note (Signed)
History   Chief Complaint:  Follow-up   First Provider Initiated Contact with Patient 04/21/15 [name] is  34 y.o. G2P1001 Patient's last menstrual period was 02/25/2015.Marland Kitchen Patient is here for day 7 quantitative HCG after MTX 04/15/15 for ectopic pregnancy.   Since her last visit, the patient is without new complaint.   The patient reports bleeding as  spotting. Denies abd pain.   General ROS:  negative  Her previous Quantitative HCG values are:  Results for Cynthia, [name] (MRN [id number]) as of 04/21/2015 18:28  Ref. Range 04/15/2015 16:47 04/18/2015 13:55  HCG, Beta Chain, Quant, S Latest Ref Range: <5 mIU/mL 5279 (H) 5330 (H)    Physical Exam   Last menstrual period 02/25/2015.  Focused Gynecological Exam: examination not indicated  Labs: Results for orders placed or performed during the hospital encounter of 04/21/15 (from the past 24 hour(s))  hCG, quantitative, pregnancy   Collection Time: 04/21/15  5:19 PM  Result Value Ref Range   hCG, Beta Chain, Quant, S 4424 (H) <5 mIU/mL  Comprehensive metabolic panel   Collection Time: 04/21/15  5:19 PM  Result Value Ref Range   Sodium 136 135 - 145 mmol/L   Potassium 3.6 3.5 - 5.1 mmol/L   Chloride 103 101 - 111 mmol/L   CO2 25 22 - 32 mmol/L   Glucose, Bld 97 70 - 99 mg/dL   BUN 12 6 - 20 mg/dL   Creatinine, Ser 0.68 0.44 - 1.00 mg/dL   Calcium 9.3 8.9 - 10.3 mg/dL   Total Protein 8.0 6.5 - 8.1 g/dL   Albumin 4.4 3.5 - 5.0 g/dL   AST 15 15 - 41 U/L   ALT 15 14 - 54 U/L   Alkaline Phosphatase 64 38 - 126 U/L   Total Bilirubin 0.3 0.3 - 1.2 mg/dL   GFR calc non Af Amer >60 >60 mL/min   GFR calc Af Amer >60 >60 mL/min   Anion gap 8 5 - 15    Ultrasound Studies:   NA   MAU Course: Quant.   Appropriate drop in quant. Pt stable.   Assessment: S/P MTX for ectopic pregnancy w/ appropriate drop in quant (>15%)  Plan: D/[name] home in stable condition per consult w/ Cynthia [name]. Ectopic precautions.   Follow-up Information    Follow up with Cynthia C, MD.   Specialty:  Obstetrics and Gynecology   Why:  weekly until quant less than 1   Contact information:   [address], [place] [address] [phone]       Follow up with Cynthia [name].   Why:  As needed in emergencies   Contact information:   [address] [id number] [place] [phone]        Medication List    ASK your doctor about these medications        ibuprofen 600 MG tablet  Commonly known as:  ADVIL,MOTRIN  Take 1 tablet (600 mg total) by mouth every 6 (six) hours.     oxyCODONE-acetaminophen 5-325 MG per tablet  Commonly known as:  PERCOCET/ROXICET  Take 1-2 tablets by mouth every 4 (four) hours as needed for severe pain (moderate - severe pain).     prenatal multivitamin Tabs tablet  Take 1 tablet by mouth at bedtime.       Cynthia [name] 04/21/2015, 6:15 PM

## 2015-04-21 NOTE — MAU Note (Signed)
Doing good, hasn't hurt any, this morning, started spotting brown.

## 2015-04-21 NOTE — Discharge Instructions (Signed)
Methotrexate Treatment for an Ectopic Pregnancy, Care After °Refer to this sheet in the next few weeks. These instructions provide you with information on caring for yourself after your procedure. Your health care provider may also give you more specific instructions. Your treatment has been planned according to current medical practices, but problems sometimes occur. Call your health care provider if you have any problems or questions after your procedure. °WHAT TO EXPECT AFTER THE PROCEDURE °You may have some abdominal cramping, vaginal bleeding, and fatigue in the first few days after taking methotrexate. Some other possible side effects of methotrexate include: °· Nausea. °· Vomiting. °· Diarrhea. °· Mouth sores. °· Swelling or irritation of the lining of your lungs (pneumonitis). °· Liver damage. °· Hair loss. °HOME CARE INSTRUCTIONS  °After you have received the methotrexate medicine, you need to be careful of your activities and watch your condition for several weeks. It may take 1 week before your hormone levels return to normal. °· Keep all follow-up appointments as directed by your health care provider. °· Avoid traveling too far away from your health care provider. °· Do not have sexual intercourse until your health care provider says it is safe to do so. °· You may resume your usual diet. °· Limit strenuous activity. °· Do not take folic acid, prenatal vitamins, or other vitamins that contain folic acid. °· Do not take aspirin, ibuprofen, or naproxen (nonsteroidal anti-inflammatory drugs [NSAIDs]). °· Do not drink alcohol. °SEEK MEDICAL CARE IF:  °· You cannot control your nausea and vomiting. °· You cannot control your diarrhea. °· You have sores in your mouth and want treatment. °· You need pain medicine for your abdominal pain. °· You have a rash. °· You are having a reaction to the medicine. °SEEK IMMEDIATE MEDICAL CARE IF:  °· You have increasing abdominal or pelvic pain. °· You notice increased  bleeding. °· You feel light-headed, or you faint. °· You have shortness of breath. °· Your heart rate increases. °· You have a cough. °· You have chills. °· You have a fever. °Document Released: 11/24/2011 Document Revised: 12/10/2013 Document Reviewed: 09/23/2013 °ExitCare® Patient Information ©2015 [employer]. This information is not intended to replace advice given to you by your health care provider. Make sure you discuss any questions you have with your health care provider. ° °

## 2015-05-06 ENCOUNTER — Encounter (HOSPITAL_COMMUNITY): Payer: Self-pay | Admitting: *Deleted

## 2015-05-06 ENCOUNTER — Inpatient Hospital Stay (HOSPITAL_COMMUNITY)
Admission: AD | Admit: 2015-05-06 | Discharge: 2015-05-06 | Disposition: A | Discharge status: Home / Self Care | Payer: BLUE CROSS/BLUE SHIELD | Source: Ambulatory Visit | Attending: Obstetrics and Gynecology | Admitting: Obstetrics and Gynecology

## 2015-05-06 DIAGNOSIS — R42 Dizziness and giddiness: Secondary | ICD-10-CM | POA: Insufficient documentation

## 2015-05-06 DIAGNOSIS — R55 Syncope and collapse: Secondary | ICD-10-CM | POA: Diagnosis not present

## 2015-05-06 DIAGNOSIS — O001 Tubal pregnancy: Secondary | ICD-10-CM | POA: Insufficient documentation

## 2015-05-06 LAB — COMPREHENSIVE METABOLIC PANEL
ALT: 13 U/L — ABNORMAL LOW (ref 14–54)
ANION GAP: 7 (ref 5–15)
AST: 17 U/L (ref 15–41)
Albumin: 4.1 g/dL (ref 3.5–5.0)
Alkaline Phosphatase: 67 U/L (ref 38–126)
BILIRUBIN TOTAL: 1 mg/dL (ref 0.3–1.2)
BUN: 15 mg/dL (ref 6–20)
CALCIUM: 9 mg/dL (ref 8.9–10.3)
CHLORIDE: 106 mmol/L (ref 101–111)
CO2: 26 mmol/L (ref 22–32)
CREATININE: 0.53 mg/dL (ref 0.44–1.00)
GFR calc Af Amer: >60 mL/min (ref >60)
Glucose, Bld: 101 mg/dL — ABNORMAL HIGH (ref 65–99)
Potassium: 4.1 mmol/L (ref 3.5–5.1)
Sodium: 139 mmol/L (ref 135–145)
Total Protein: 7.3 g/dL (ref 6.5–8.1)

## 2015-05-06 LAB — URINALYSIS, ROUTINE W REFLEX MICROSCOPIC
BILIRUBIN URINE: NEGATIVE
Glucose, UA: NEGATIVE mg/dL
Hgb urine dipstick: NEGATIVE
Ketones, ur: NEGATIVE mg/dL
Leukocytes, UA: NEGATIVE
Nitrite: NEGATIVE
PH: 6 (ref 5.0–8.0)
Protein, ur: NEGATIVE mg/dL
Specific Gravity, Urine: >1.03 — ABNORMAL HIGH (ref 1.005–1.030)
Urobilinogen, UA: 0.2 mg/dL (ref 0.0–1.0)

## 2015-05-06 LAB — CBC
HEMATOCRIT: 26.3 % — AB (ref 36.0–46.0)
Hemoglobin: 8.7 g/dL — ABNORMAL LOW (ref 12.0–15.0)
MCH: 29.4 pg (ref 26.0–34.0)
MCHC: 33.1 g/dL (ref 30.0–36.0)
MCV: 88.9 fL (ref 78.0–100.0)
Platelets: 295 10*3/uL (ref 150–400)
RBC: 2.96 MIL/uL — AB (ref 3.87–5.11)
RDW: 13.3 % (ref 11.5–15.5)
WBC: 8.6 10*3/uL (ref 4.0–10.5)

## 2015-05-06 LAB — HCG, QUANTITATIVE, PREGNANCY: hCG, Beta Chain, Quant, S: 721 m[IU]/mL — ABNORMAL HIGH (ref <5)

## 2015-05-06 MED ORDER — LACTATED RINGERS IV BOLUS (SEPSIS)
1000.0000 mL | Freq: Once | INTRAVENOUS | Status: AC
  Administered 2015-05-06: 1000 mL via INTRAVENOUS

## 2015-05-06 NOTE — Progress Notes (Signed)
RT requested for EKG

## 2015-05-06 NOTE — Progress Notes (Signed)
Patient was assisted to BR and tolerated well. Dr. Helane Rima in to assess patient and discuss plan of care.

## 2015-05-06 NOTE — H&P (Signed)
History reviewed with patient  Patient is feeling much better now.  She has ambulated and voiding without any difficulty. Her pain is 0 and she reports her abdomen is flatter today than it was on Monday.  BP 114/65 mmHg  Pulse 99  Temp(Src) 98.5 F (36.9 C) (Oral)  Resp 16  Ht 5\' 3"  (1.6 m)  Wt 58.06 kg (128 lb)  BMI 22.68 kg/m2  SpO2 100%  LMP 02/25/2015 Results for orders placed or performed during the hospital encounter of 05/06/15 (from the past 24 hour(s))  Urinalysis, Routine w reflex microscopic     Status: Abnormal   Collection Time: 05/06/15  8:16 AM  Result Value Ref Range   Color, Urine YELLOW YELLOW   APPearance CLEAR CLEAR   Specific Gravity, Urine >1.030 (H) 1.005 - 1.030   pH 6.0 5.0 - 8.0   Glucose, UA NEGATIVE NEGATIVE mg/dL   Hgb urine dipstick NEGATIVE NEGATIVE   Bilirubin Urine NEGATIVE NEGATIVE   Ketones, ur NEGATIVE NEGATIVE mg/dL   Protein, ur NEGATIVE NEGATIVE mg/dL   Urobilinogen, UA 0.2 0.0 - 1.0 mg/dL   Nitrite NEGATIVE NEGATIVE   Leukocytes, UA NEGATIVE NEGATIVE  hCG, quantitative, pregnancy     Status: Abnormal   Collection Time: 05/06/15  9:22 AM  Result Value Ref Range   hCG, Beta Chain, Quant, S 721 (H) <5 mIU/mL  CBC     Status: Abnormal   Collection Time: 05/06/15  9:23 AM  Result Value Ref Range   WBC 8.6 4.0 - 10.5 K/uL   RBC 2.96 (L) 3.87 - 5.11 MIL/uL   Hemoglobin 8.7 (L) 12.0 - 15.0 g/dL   HCT 26.3 (L) 36.0 - 46.0 %   MCV 88.9 78.0 - 100.0 fL   MCH 29.4 26.0 - 34.0 pg   MCHC 33.1 30.0 - 36.0 g/dL   RDW 13.3 11.5 - 15.5 %   Platelets 295 150 - 400 K/uL  Comprehensive metabolic panel     Status: Abnormal   Collection Time: 05/06/15  9:23 AM  Result Value Ref Range   Sodium 139 135 - 145 mmol/L   Potassium 4.1 3.5 - 5.1 mmol/L   Chloride 106 101 - 111 mmol/L   CO2 26 22 - 32 mmol/L   Glucose, Bld 101 (H) 65 - 99 mg/dL   BUN 15 6 - 20 mg/dL   Creatinine, Ser 0.53 0.44 - 1.00 mg/dL   Calcium 9.0 8.9 - 10.3 mg/dL   Total  Protein 7.3 6.5 - 8.1 g/dL   Albumin 4.1 3.5 - 5.0 g/dL   AST 17 15 - 41 U/L   ALT 13 (L) 14 - 54 U/L   Alkaline Phosphatase 67 38 - 126 U/L   Total Bilirubin 1.0 0.3 - 1.2 mg/dL   GFR calc non Af Amer >60 >60 mL/min   GFR calc Af Amer >60 >60 mL/min   Anion gap 7 5 - 15   General alert and oriented Abdomen is soft and flat and non tender No rebound or guarding  IMPRESSION: Ectopic Pregnancy Status post syncopal episode  PLAN: Patient is very stable now I believe she had a tubal abortion and hemoperitoneum noted yesterday in office with Dr. Lynnette Caffey was a results of that. Counseled patient and her husband extensively -  She does not have a surgical abdomen today. Recommend complete IV fluids in MAU and discharge home with close follow up with Dr. Lynnette Caffey tomorrow Reviewed strict ectopic precautions. Discussed laparoscopy but patient is doing well clinically and  I do not believe she needs one now.

## 2015-05-06 NOTE — MAU Note (Addendum)
Patient states she has ectopic and received MTX  3 weeks ago. States she passed out this AM. Called EMS, they came and checked her out and said she was OK. States she planned to come to the hospital after that anyway to make sure everything is OK and she became dizzy and passed out again. Was brought to hospital by husband. On arrival patient able to stand for weight and go to BR for urine sample. Patient is pale, skin warm and dry. Altamease Oiler, NP was given report at desk.

## 2015-05-06 NOTE — Progress Notes (Signed)
Regular diet given to patient.

## 2015-05-06 NOTE — Progress Notes (Signed)
Patient states BHCG yesterday was around 930.

## 2015-05-06 NOTE — MAU Provider Note (Signed)
History     CSN: [id number]  Arrival date and time: 05/06/15 5449   First Provider Initiated Contact with Patient 05/06/15 [phone]      Chief Complaint  Patient presents with  . Loss of Consciousness   HPI   Ms. Cynthia [name] is a 34 y.o. female who presents with dizziness and passing out episode. She has a known ectopic pregnancy and has been followed in the office for beta hcg levels.  She was last seen yesterday in the office and her beta hcg level was in the 900's.   This morning she woke up and started getting ready; she was standing at the sink and passed out. She fell; this was unwitnessed  She called 911 and they came and told her she was ok and told her she should be evaluated but was ok to be taken by private vehicle. She got up to brush her teeth and felt dizzy once again.   She denies pain at this time. She denies vaginal bleeding   OB History    Gravida Para Term Preterm AB TAB SAB Ectopic Multiple Living   2 1 1   0   0  1      Obstetric Comments   malpresentation      Past Medical History  Diagnosis Date  . Anxiety   . Hemorrhoids     Past Surgical History  Procedure Laterality Date  . Cholecystectomy    . Cesarean section N/A 12/20/2013    Procedure: CESAREAN SECTION;  Surgeon: Linda Hedges, DO;  Location: [facility];  Service: Obstetrics;  Laterality: N/A;    Family History  Problem Relation Age of Onset  . Heart disease Father     History  Substance Use Topics  . Smoking status: Never Smoker   . Smokeless tobacco: Never Used  . Alcohol Use: No    Allergies:  Allergies  Allergen Reactions  . Avelox [Moxifloxacin] Other (See Comments)    Pt felt faint and passed out    Prescriptions prior to admission  Medication Sig Dispense Refill Last Dose  . methotrexate (50 MG/ML) 1 gm SOLR Inject 50 mg/m2 into the muscle once.   04/15/2015  . ibuprofen (ADVIL,MOTRIN) 600 MG tablet Take 1 tablet (600 mg total) by mouth every 6 (six) hours. (Patient not  taking: Reported on 04/15/2015) 30 tablet 0 Not Taking at Unknown time  . oxyCODONE-acetaminophen (PERCOCET/ROXICET) 5-325 MG per tablet Take 1-2 tablets by mouth every 4 (four) hours as needed for severe pain (moderate - severe pain). (Patient not taking: Reported on 04/15/2015) 30 tablet 0 Not Taking at Unknown time   Results for orders placed or performed during the hospital encounter of 05/06/15 (from the past 48 hour(s))  Urinalysis, Routine w reflex microscopic     Status: Abnormal   Collection Time: 05/06/15  8:16 AM  Result Value Ref Range   Color, Urine YELLOW YELLOW   APPearance CLEAR CLEAR   Specific Gravity, Urine >1.030 (H) 1.005 - 1.030   pH 6.0 5.0 - 8.0   Glucose, UA NEGATIVE NEGATIVE mg/dL   Hgb urine dipstick NEGATIVE NEGATIVE   Bilirubin Urine NEGATIVE NEGATIVE   Ketones, ur NEGATIVE NEGATIVE mg/dL   Protein, ur NEGATIVE NEGATIVE mg/dL   Urobilinogen, UA 0.2 0.0 - 1.0 mg/dL   Nitrite NEGATIVE NEGATIVE   Leukocytes, UA NEGATIVE NEGATIVE    Comment: MICROSCOPIC NOT DONE ON URINES WITH NEGATIVE PROTEIN, BLOOD, LEUKOCYTES, NITRITE, OR GLUCOSE <1000 mg/dL.  hCG, quantitative, pregnancy  Status: Abnormal   Collection Time: 05/06/15  9:22 AM  Result Value Ref Range   hCG, Beta Chain, Quant, S 721 (H) <5 mIU/mL    Comment:          GEST. AGE      CONC.  (mIU/mL)   <=1 WEEK        5 - 50     2 WEEKS       50 - 500     3 WEEKS       100 - 10,000     4 WEEKS     1,000 - 30,000     5 WEEKS     3,500 - 115,000   6-8 WEEKS     12,000 - 270,000    12 WEEKS     15,000 - 220,000        FEMALE AND NON-PREGNANT FEMALE:     LESS THAN 5 mIU/mL   CBC     Status: Abnormal   Collection Time: 05/06/15  9:23 AM  Result Value Ref Range   WBC 8.6 4.0 - 10.5 K/uL   RBC 2.96 (L) 3.87 - 5.11 MIL/uL   Hemoglobin 8.7 (L) 12.0 - 15.0 g/dL   HCT 26.3 (L) 36.0 - 46.0 %   MCV 88.9 78.0 - 100.0 fL   MCH 29.4 26.0 - 34.0 pg   MCHC 33.1 30.0 - 36.0 g/dL   RDW 13.3 11.5 - 15.5 %   Platelets  295 150 - 400 K/uL  Comprehensive metabolic panel     Status: Abnormal   Collection Time: 05/06/15  9:23 AM  Result Value Ref Range   Sodium 139 135 - 145 mmol/L   Potassium 4.1 3.5 - 5.1 mmol/L   Chloride 106 101 - 111 mmol/L   CO2 26 22 - 32 mmol/L   Glucose, Bld 101 (H) 65 - 99 mg/dL   BUN 15 6 - 20 mg/dL   Creatinine, Ser 0.53 0.44 - 1.00 mg/dL   Calcium 9.0 8.9 - 10.3 mg/dL   Total Protein 7.3 6.5 - 8.1 g/dL   Albumin 4.1 3.5 - 5.0 g/dL   AST 17 15 - 41 U/L   ALT 13 (L) 14 - 54 U/L   Alkaline Phosphatase 67 38 - 126 U/L   Total Bilirubin 1.0 0.3 - 1.2 mg/dL   GFR calc non Af Amer >60 >60 mL/min   GFR calc Af Amer >60 >60 mL/min    Comment: (NOTE) The eGFR has been calculated using the CKD EPI equation. This calculation has not been validated in all clinical situations. eGFR's persistently <60 mL/min signify possible Chronic Kidney Disease.    Anion gap 7 5 - 15    Review of Systems  Gastrointestinal: Positive for nausea. Negative for vomiting.  Genitourinary:       Denies vaginal bleeding   Neurological: Negative for dizziness and headaches.   Physical Exam   Blood pressure 123/74, pulse 115, temperature 98.5 F (36.9 C), temperature source Oral, resp. rate 18, height 5' 3"  (1.6 m), weight 58.06 kg (128 lb), last menstrual period 02/25/2015, SpO2 100 %, unknown if currently breastfeeding.  Physical Exam  Nursing note and vitals reviewed. Constitutional: She is oriented to person, place, and time. Vital signs are normal. She appears well-developed and well-nourished.  Non-toxic appearance. She does not have a sickly appearance. She does not appear ill. No distress.  HENT:  Head: Normocephalic.  Eyes: Pupils are equal, round, and reactive to light.  Neck:  Neck supple.  Cardiovascular: Tachycardia present.   GI: Soft. She exhibits no distension. There is no tenderness. There is no rebound and no guarding.  Musculoskeletal: Normal range of motion.  Neurological: She  is alert and oriented to person, place, and time.  Skin: Skin is warm. She is not diaphoretic. There is pallor.    MAU Course  Procedures  MDM Discussed labs and vitals with Dr. Helane Rima at 1005. Dr. Helane Rima would like to come to MAU to discuss options with the patient. Patient at this time is hemodynamically stable. Ekg ordered   Orthostatic vital signs normal EKG normal sinus rhythm; possible lead misplaced   LR bolus Patient requesting to order food> ok per Dr. Helane Rima.   Assessment and Plan   A:  Syncopal episode Dizziness  P: Dr. Helane Rima to MAU to see the patient.    Lezlie Lye, NP 05/06/2015 7:55 PM

## 2015-05-06 NOTE — Discharge Instructions (Signed)
Keep your appointment with Dr. Lynnette Caffey. Call the office or return to MAU with further concerns.

## 2016-02-18 ENCOUNTER — Encounter (HOSPITAL_COMMUNITY): Payer: Self-pay | Admitting: *Deleted

## 2018-01-01 DIAGNOSIS — I10 Essential (primary) hypertension: Secondary | ICD-10-CM | POA: Insufficient documentation

## 2018-01-25 ENCOUNTER — Other Ambulatory Visit
Admission: RE | Admit: 2018-01-25 | Discharge: 2018-01-25 | Disposition: A | Discharge status: Home / Self Care | Payer: BLUE CROSS/BLUE SHIELD | Source: Ambulatory Visit | Attending: Internal Medicine | Admitting: Internal Medicine

## 2018-01-25 DIAGNOSIS — I10 Essential (primary) hypertension: Secondary | ICD-10-CM | POA: Diagnosis present

## 2018-01-25 DIAGNOSIS — R009 Unspecified abnormalities of heart beat: Secondary | ICD-10-CM | POA: Insufficient documentation

## 2018-01-25 DIAGNOSIS — R0789 Other chest pain: Secondary | ICD-10-CM | POA: Insufficient documentation

## 2018-01-25 LAB — TROPONIN I

## 2018-01-26 DIAGNOSIS — R911 Solitary pulmonary nodule: Secondary | ICD-10-CM | POA: Insufficient documentation

## 2018-09-19 ENCOUNTER — Other Ambulatory Visit (HOSPITAL_COMMUNITY): Payer: Self-pay | Admitting: Obstetrics & Gynecology

## 2018-09-19 DIAGNOSIS — Z3141 Encounter for fertility testing: Secondary | ICD-10-CM

## 2018-09-26 ENCOUNTER — Ambulatory Visit (HOSPITAL_COMMUNITY)
Admission: RE | Admit: 2018-09-26 | Discharge: 2018-09-26 | Disposition: A | Discharge status: Home / Self Care | Payer: BLUE CROSS/BLUE SHIELD | Source: Ambulatory Visit | Attending: Obstetrics & Gynecology | Admitting: Obstetrics & Gynecology

## 2018-09-26 DIAGNOSIS — Z3141 Encounter for fertility testing: Secondary | ICD-10-CM | POA: Insufficient documentation

## 2018-09-26 MED ORDER — IOPAMIDOL (ISOVUE-300) INJECTION 61%
30.0000 mL | Freq: Once | INTRAVENOUS | Status: AC | PRN
  Administered 2018-09-26: 30 mL

## 2019-10-04 DIAGNOSIS — F419 Anxiety disorder, unspecified: Secondary | ICD-10-CM | POA: Insufficient documentation

## 2020-01-06 IMAGING — RF DG HYSTEROGRAM
5 series · 5 of 5 positions shown · non-contrast
Comparison: None.

CLINICAL DATA: Infertility.

EXAM:
HYSTEROSALPINGOGRAM
TECHNIQUE: Hysterosalpingogram was performed by the ordering physician under
fluoroscopy. Fluoroscopic images were submitted for radiologic
interpretation following the procedure. Please see the procedural
report for the amount of contrast and the fluoroscopy time utilized.

[Series 1: run · 1 of 1 slices shown (1 of 5)]
[im 1/1]
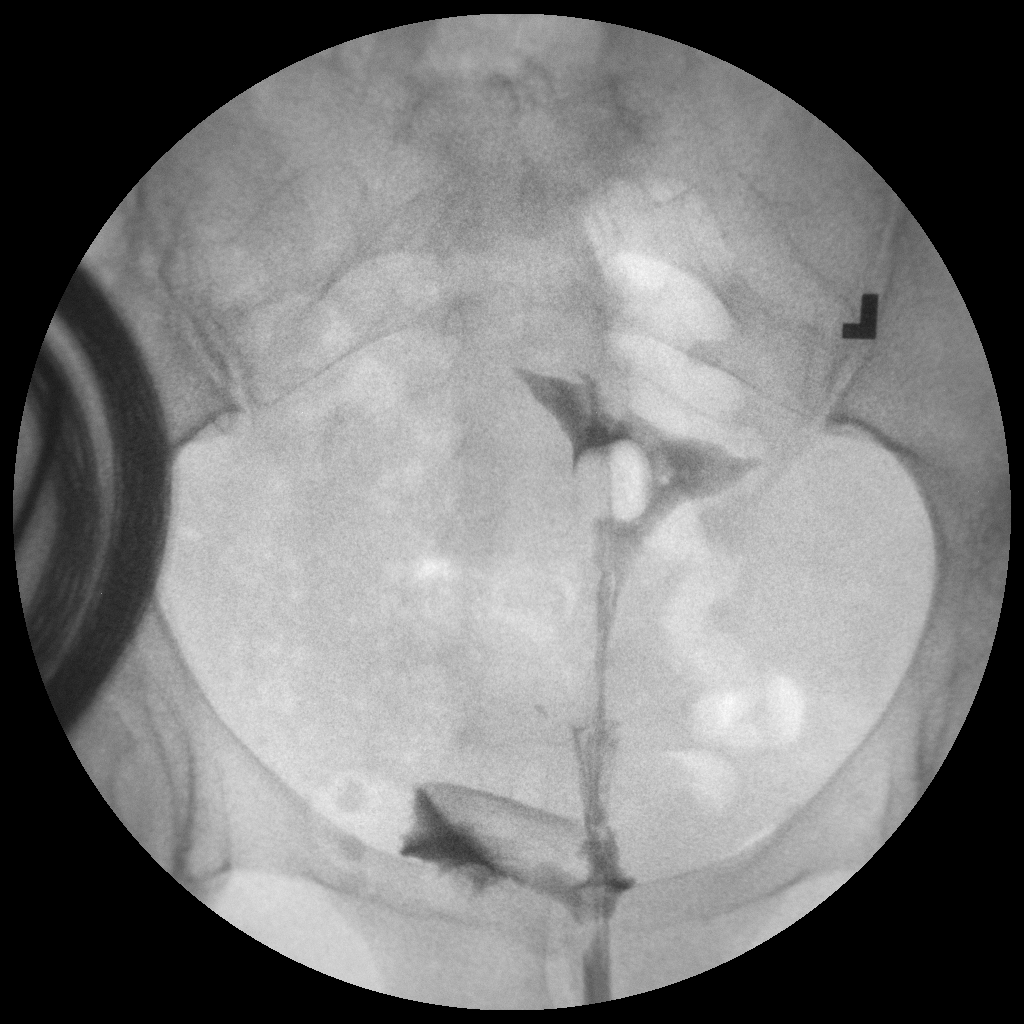

[Series 2: run · 1 of 1 slices shown (2 of 5)]
[im 1/1]
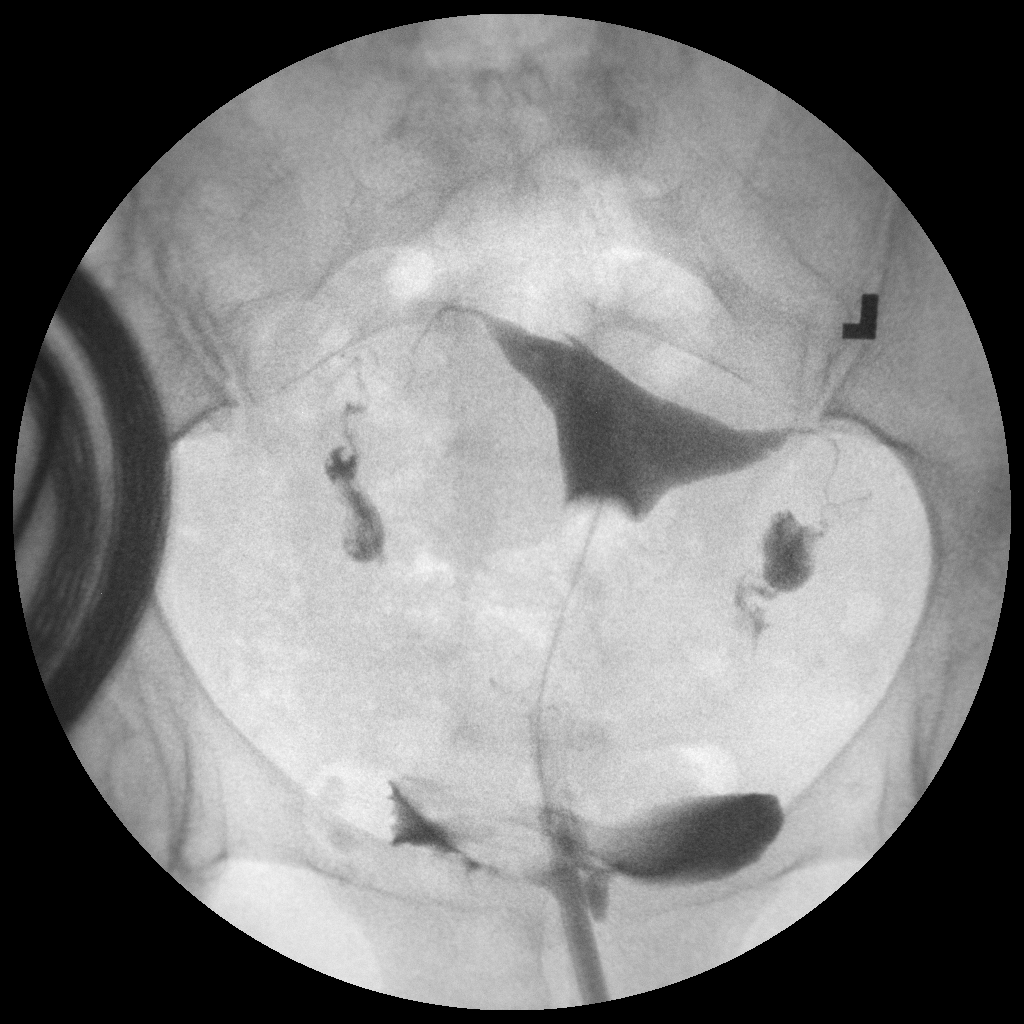

[Series 3: run · 1 of 1 slices shown (3 of 5)]
[im 1/1]
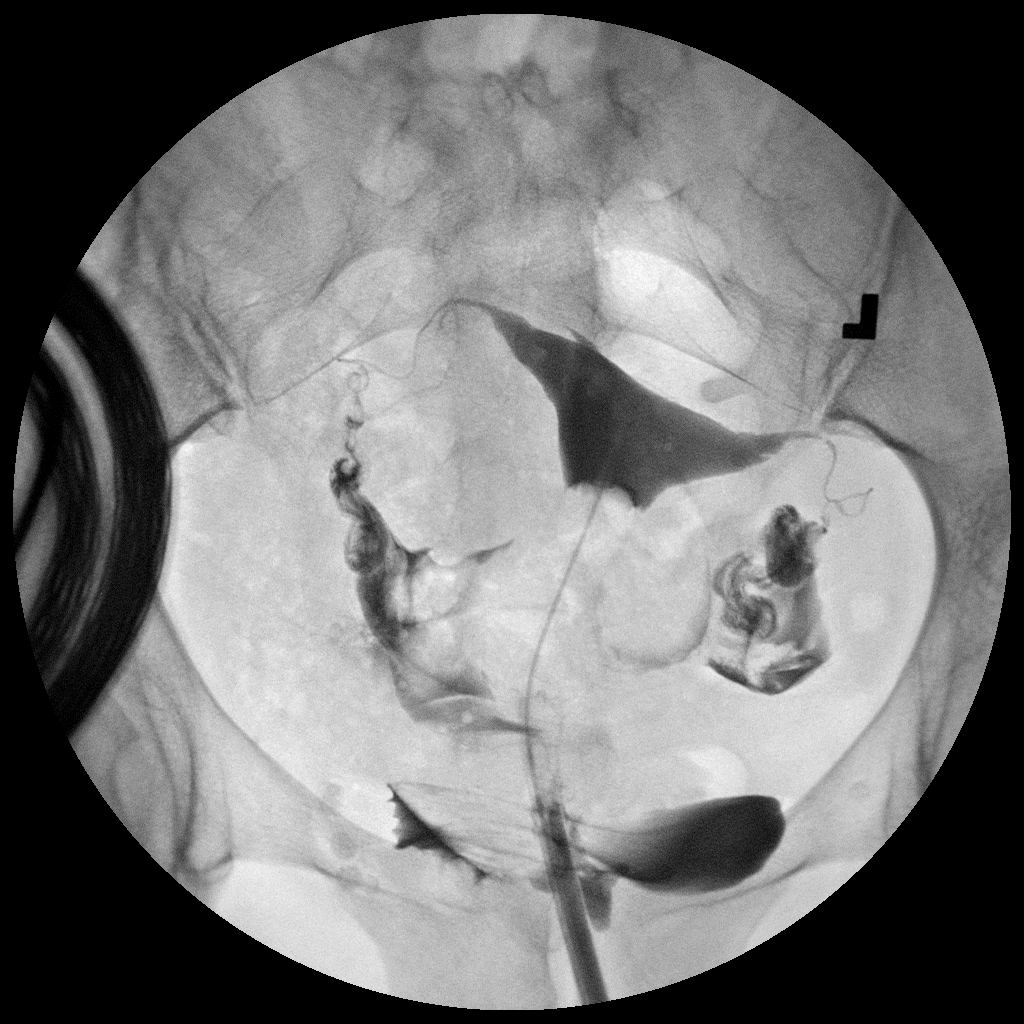

[Series 4: run · 1 of 1 slices shown (4 of 5)]
[im 1/1]
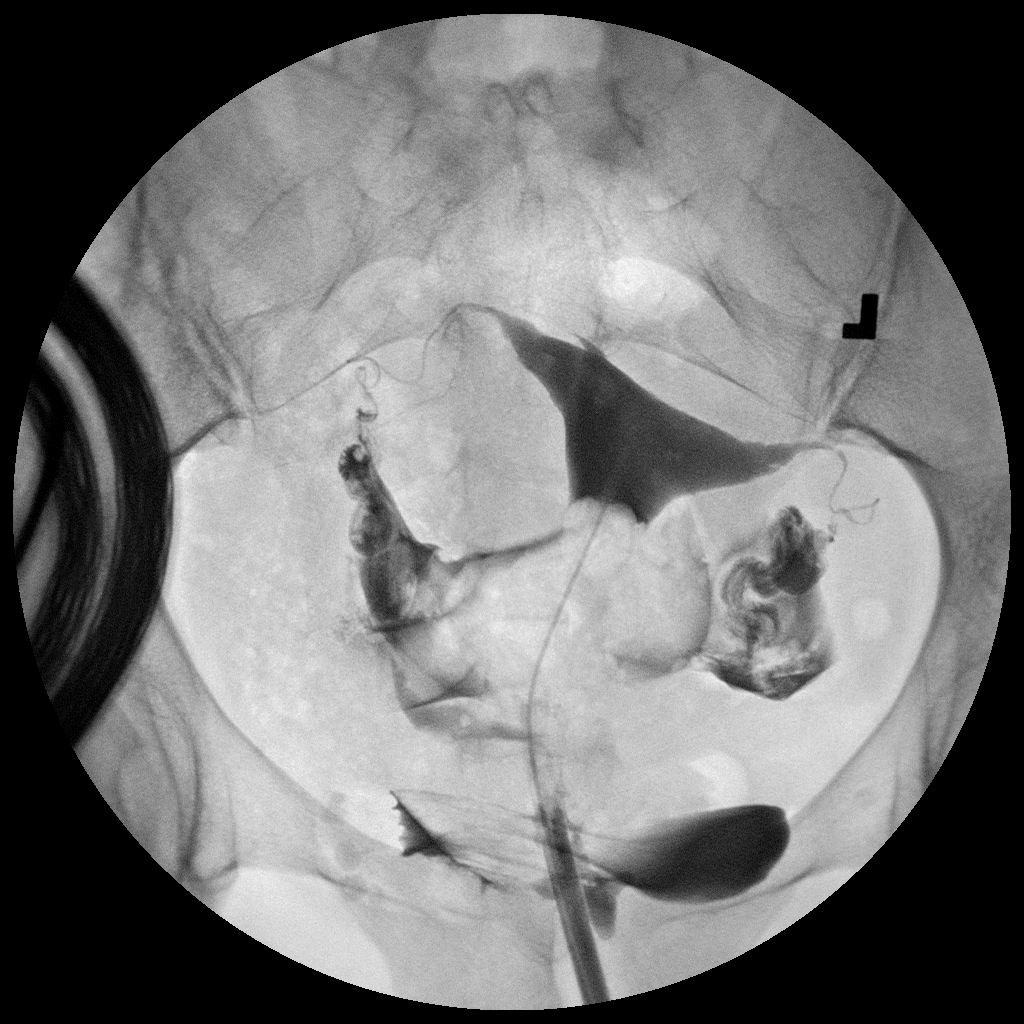

[Series 5: run · 1 of 1 slices shown (5 of 5)]
[im 1/1]
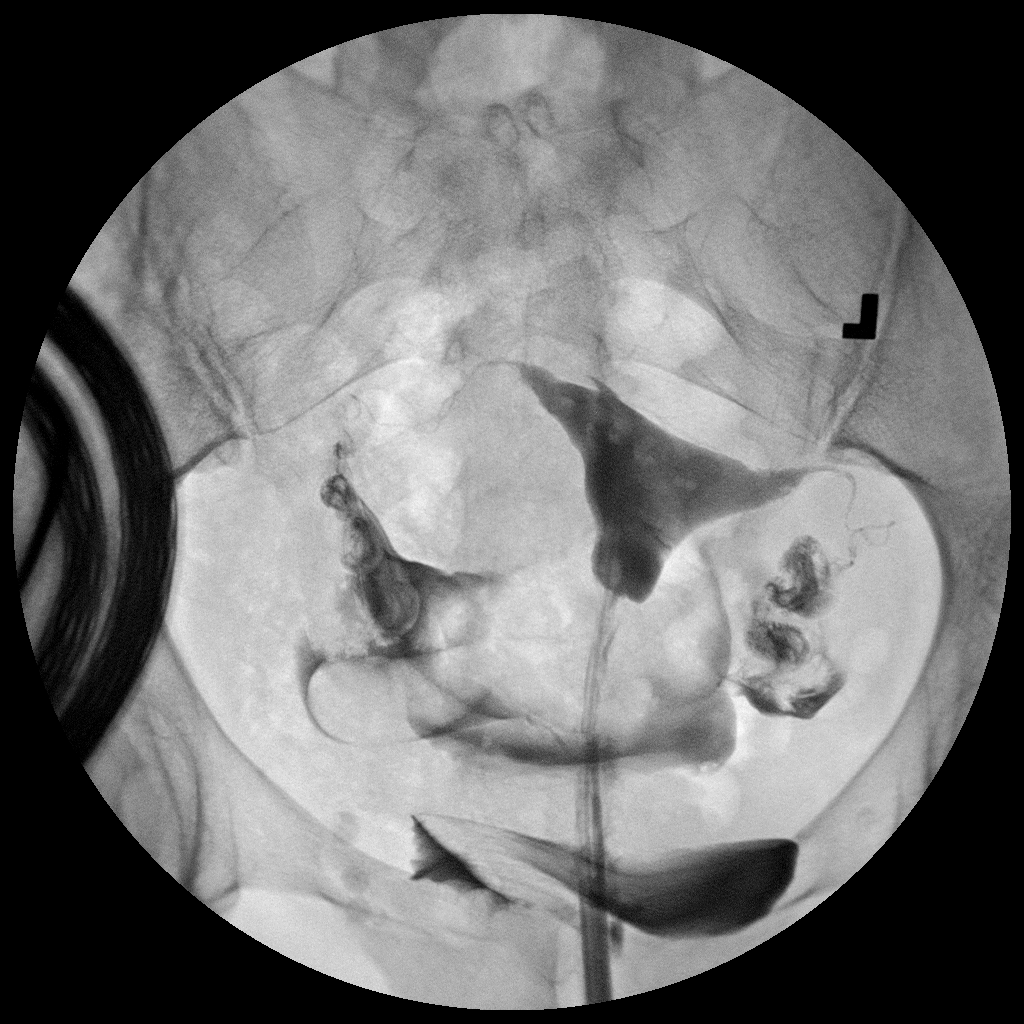

[5 of 5 positions shown; findings below may reference images not displayed]

FINDINGS: Endometrial Cavity: Normal appearance. No signs of Mullerian duct
anomaly or other significant abnormality.

Right Fallopian Tube: Normal appearance. Free intraperitoneal spill
of contrast is demonstrated.

Left Fallopian Tube: Normal appearance. Free intraperitoneal spill
of contrast is demonstrated.

Other:  None.
IMPRESSION: Normal study. Both fallopian tubes are patent.

## 2021-02-04 ENCOUNTER — Ambulatory Visit (INDEPENDENT_AMBULATORY_CARE_PROVIDER_SITE_OTHER): Payer: BC Managed Care – PPO | Admitting: Dermatology

## 2021-02-04 ENCOUNTER — Other Ambulatory Visit: Payer: Self-pay

## 2021-02-04 DIAGNOSIS — D489 Neoplasm of uncertain behavior, unspecified: Secondary | ICD-10-CM

## 2021-02-04 DIAGNOSIS — D225 Melanocytic nevi of trunk: Secondary | ICD-10-CM | POA: Diagnosis not present

## 2021-02-04 DIAGNOSIS — Z1283 Encounter for screening for malignant neoplasm of skin: Secondary | ICD-10-CM | POA: Diagnosis not present

## 2021-02-04 DIAGNOSIS — L814 Other melanin hyperpigmentation: Secondary | ICD-10-CM

## 2021-02-04 DIAGNOSIS — L578 Other skin changes due to chronic exposure to nonionizing radiation: Secondary | ICD-10-CM | POA: Diagnosis not present

## 2021-02-04 DIAGNOSIS — D0362 Melanoma in situ of left upper limb, including shoulder: Secondary | ICD-10-CM | POA: Diagnosis not present

## 2021-02-04 DIAGNOSIS — D229 Melanocytic nevi, unspecified: Secondary | ICD-10-CM

## 2021-02-04 DIAGNOSIS — L219 Seborrheic dermatitis, unspecified: Secondary | ICD-10-CM | POA: Diagnosis not present

## 2021-02-04 DIAGNOSIS — D1800 Hemangioma unspecified site: Secondary | ICD-10-CM

## 2021-02-04 DIAGNOSIS — L988 Other specified disorders of the skin and subcutaneous tissue: Secondary | ICD-10-CM | POA: Diagnosis not present

## 2021-02-04 DIAGNOSIS — L821 Other seborrheic keratosis: Secondary | ICD-10-CM

## 2021-02-04 HISTORY — DX: Melanoma in situ of left upper limb, including shoulder: D03.62

## 2021-02-04 MED ORDER — HYDROCORTISONE 2.5 % EX CREA: TOPICAL_CREAM | Freq: Two times a day (BID) | CUTANEOUS | 2 refills | Status: AC

## 2021-02-04 MED ORDER — VALACYCLOVIR HCL 500 MG PO TABS
500.0000 mg | ORAL_TABLET | Freq: Two times a day (BID) | ORAL | 1 refills | Status: DC
Start: 2021-02-04 — End: 2021-09-09

## 2021-02-04 MED ORDER — KETOCONAZOLE 2 % EX SHAM: 1.0000 "application " | MEDICATED_SHAMPOO | Freq: Once | CUTANEOUS | 11 refills | Status: AC

## 2021-02-04 MED ORDER — KETOCONAZOLE 2 % EX CREA: 1.0000 "application " | TOPICAL_CREAM | Freq: Two times a day (BID) | CUTANEOUS | 11 refills | Status: AC

## 2021-02-04 MED ORDER — CLOBETASOL PROPIONATE 0.05 % EX SOLN
1.0000 | Freq: Two times a day (BID) | CUTANEOUS | 2 refills | Status: AC | PRN
Start: 2021-02-04 — End: ?

## 2021-02-04 NOTE — Progress Notes (Signed)
New Patient Visit  Subjective  Cynthia [name] is a 40 y.o. female who presents for the following: New Patient (Initial Visit) (Patient here for intial visit and tbse.She has concerns for area around nose reports seeing provider 6 months ago and being prescribed doxycycline 20 mg. She completed medication but states issue is unresolved. She also had something removed from left knee when she was 31. )  Patient here for full body skin exam and skin cancer screening.  Objective  Well appearing patient in no apparent distress; mood and affect are within normal limits.  A full examination was performed including scalp, head, eyes, ears, nose, lips, neck, chest, axillae, abdomen, back, buttocks, bilateral upper extremities, bilateral lower extremities, hands, feet, fingers, toes, fingernails, and toenails. All findings within normal limits unless otherwise noted below.  Objective  Nose, scalp: Pink patches with greasy scale.   Objective  left upper arm: 0.4 cm irregular dark brown thin papule      Objective  mid upper abdomen: 1.0 cm erythematous tan soft plaque        Objective  upper back left of midline: 0.25 cm dark brown thin papule   Images    Assessment & Plan  Seborrheic dermatitis Nose, scalp  Chronic condition with expected duration over one year. Condition is bothersome to patient. Currently flared.  Start Ketoconazole cream use twice daily until clear then as needed   Start hydrocortisone 2.5 % cream use twice daily for up to 2 week to affected areas as needed   Start Ketoconazole shampoo - apply three times per week, massage into scalp and leave in for 10 minutes before rinsing out  Start Clobetasol solution daily as needed for itch or scale to scalp. Avoid applying to face, groin, and axilla. Use as directed. Risk of skin atrophy with long-term use reviewed.   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 Anesthesia: the lesion was anesthetized in a standard fashion   Anesthetic:  1% lidocaine w/ epinephrine 1-100,000 buffered w/ 8.4% NaHCO3 Instrument used: flexible razor blade   Hemostasis achieved with: aluminum chloride   Outcome: patient tolerated procedure well   Post-procedure details: wound care instructions given   Additional details:  Mupirocin and a bandage applied  Specimen 1 - Surgical pathology Differential Diagnosis: r/o atypia   Check Margins: No 0.4 cm irregular dark brown thin papule  mid upper abdomen  Epidermal / dermal shaving  Lesion diameter (cm):  1 Informed consent: discussed and consent obtained   Timeout: patient name, date of birth, surgical site, and procedure verified   Patient was prepped and draped in usual sterile fashion: area prepped with isopropyl alcohol. Anesthesia: the lesion was anesthetized in a standard fashion   Anesthetic:  1% lidocaine w/ epinephrine 1-100,000 buffered w/ 8.4% NaHCO3 Instrument used: flexible razor blade   Hemostasis achieved with: aluminum chloride   Outcome: patient tolerated procedure well   Post-procedure details: wound care instructions given   Additional details:  Mupirocin and a bandage applied  Specimen 2 - Surgical pathology Differential Diagnosis: R/o irritated nevus vs other  Check Margins: No 1.0 cm  erythematous tan soft plaque  R/o atypia   R/o irritated nevus vs other   Melanocytic nevi of trunk upper back left of midline  Benign-appearing.  Recheck at follow-up.  Call clinic for new or changing lesions.   Recommend daily use of broad spectrum spf 30+ sunscreen to sun-exposed areas.    Elastosis of skin Head - Anterior (Face)  Recommend peel - perfect derma peel Patient has history of cold sores Start valacyclovir 500 mg tablet by mouth twice daily for 10 days starting 1 day before peel  Ordered Medications: valACYclovir (VALTREX) 500 MG tablet   Lentigines - Scattered tan macules - Discussed due to sun exposure - Benign, observe - Call for any changes  Seborrheic Keratoses - Stuck-on, waxy, tan-brown papules and plaques  - Discussed benign etiology and prognosis. - Observe - Call for any changes  Melanocytic Nevi - Tan-brown and/or pink-flesh-colored symmetric macules and papules - Benign appearing on exam today - Observation - Call clinic for new or changing moles - Recommend daily use of broad spectrum spf 30+ sunscreen to sun-exposed areas.   Hemangiomas - Red papules - Discussed benign nature - Observe - Call for any changes  Actinic Damage Face  - Chronic, secondary to cumulative UV/sun exposure - diffuse scaly erythematous macules with underlying dyspigmentation - Recommend daily broad spectrum sunscreen SPF 30+ to sun-exposed areas, reapply every 2 hours as needed.  - Call for new or changing lesions.  Skin cancer screening performed today.  Return in about 6 weeks (around 03/18/2021) for follow up on seb derm, recheck nevus at back, possible perfect derma peel, anti-aging rx.  I, Ruthell Rummage, CMA, am acting as scribe for Forest Gleason, MD.  Documentation: I have reviewed the above documentation for accuracy and completeness, and I agree with the above.  Forest Gleason, MD

## 2021-02-04 NOTE — Patient Instructions (Addendum)
Melanoma ABCDEs  Melanoma is the most dangerous type of skin cancer, and is the leading cause of death from skin disease.  You are more likely to develop melanoma if you:  Have light-colored skin, light-colored eyes, or red or blond hair  Spend a lot of time in the sun  Tan regularly, either outdoors or in a tanning bed  Have had blistering sunburns, especially during childhood  Have a close family member who has had a melanoma  Have atypical moles or large birthmarks  Early detection of melanoma is key since treatment is typically straightforward and cure rates are extremely high if we catch it early.   The first sign of melanoma is often a change in a mole or a new dark spot.  The ABCDE system is a way of remembering the signs of melanoma.  A for asymmetry:  The two halves do not match. B for border:  The edges of the growth are irregular. C for color:  A mixture of colors are present instead of an even brown color. D for diameter:  Melanomas are usually (but not always) greater than 31mm - the size of a pencil eraser. E for evolution:  The spot keeps changing in size, shape, and color.  Please check your skin once per month between visits. You can use a small mirror in front and a large mirror behind you to keep an eye on the back side or your body.   If you see any new or changing lesions before your next follow-up, please call to schedule a visit.  Please continue daily skin protection including broad spectrum sunscreen SPF 30+ to sun-exposed areas, reapplying every 2 hours as needed when you're outdoors.   Recommend taking Heliocare sun protection supplement daily in sunny weather for additional sun protection. For maximum protection on the sunniest days, you can take up to 2 capsules of regular Heliocare OR take 1 capsule of Heliocare Ultra. For prolonged exposure (such as a full day in the sun), you can repeat your dose of the supplement 4 hours after your first dose. Heliocare  can be purchased at [facility] or at [email or web address].    Biopsy Wound Care Instructions  1. Leave the original bandage on for 24 hours if possible.  If the bandage becomes soaked or soiled before that time, it is OK to remove it and examine the wound.  A small amount of post-operative bleeding is normal.  If excessive bleeding occurs, remove the bandage, place gauze over the site and apply continuous pressure (no peeking) over the area for 30 minutes. If this does not work, please call our clinic as soon as possible or page your doctor if it is after hours.   2. Once a day, cleanse the wound with soap and water. It is fine to shower. If a thick crust develops you may use a Q-tip dipped into dilute hydrogen peroxide (mix 1:1 with water) to dissolve it.  Hydrogen peroxide can slow the healing process, so use it only as needed.    3. After washing, apply petroleum jelly (Vaseline) or an antibiotic ointment if your doctor prescribed one for you, followed by a bandage.    4. For best healing, the wound should be covered with a layer of ointment at all times. If you are not able to keep the area covered with a bandage to hold the ointment in place, this may mean re-applying the ointment several times a day.  Continue this wound care until  the wound has healed and is no longer open.   Itching and mild discomfort is normal during the healing process. However, if you develop pain or severe itching, please call our office.   If you have any discomfort, you can take Tylenol (acetaminophen) or ibuprofen as directed on the bottle. (Please do not take these if you have an allergy to them or cannot take them for another reason).  Some redness, tenderness and white or yellow material in the wound is normal healing.  If the area becomes very sore and red, or develops a thick yellow-green material (pus), it may be infected; please notify us.    If you have stitches, return to clinic as directed to have  the stitches removed. You will continue wound care for 2-3 days after the stitches are removed.   Wound healing continues for up to one year following surgery. It is not unusual to experience pain in the scar from time to time during the interval.  If the pain becomes severe or the scar thickens, you should notify the office.    A slight amount of redness in a scar is expected for the first six months.  After six months, the redness will fade and the scar will soften and fade.  The color difference becomes less noticeable with time.  If there are any problems, return for a post-op surgery check at your earliest convenience.  To improve the appearance of the scar, you can use silicone scar gel, cream, or sheets (such as Mederma or Serica) every night for up to one year. These are available over the counter (without a prescription).  Please call our office at [phone] for any questions or concerns.   Seborrheic Dermatitis  What is seborrheic dermatitis? Seborrheic (say: seb-oh-ree-ick) dermatitis is a disease that causes flaking of the skin.  It usually affects the scalp.  In teenagers and adults, it is commonly called "dandruff".  In infants, it is referred to as "cradle cap".  Dandruff often appears as scaling on the scalp with or without redness.  On other parts of the body, seborrheic dermatitis tends to produce both redness and scaling.  Other common locations of seborrheic dermatitis include the central face, eyebrows, chest, and the creases of the arms, legs, and groin.  It often causes the skin to look a little greasy, scaly, or flaky. Seborrheic dermatitis can occur at any age.  It often comes and goes and may to be seasonally related, especially in the Northern climates.  What causes seborrheic dermatitis? The exact cause is not known, though yeast of the Malassezia species may be involved.  This organism is normally present on the skin in small numbers, but sometimes its numbers  increase, especially in oily skin.  Treatments that reduce the yeast tend to improve seborrheic dermatitis.  How is seborrheic dermatitis treated? The treatment of seborrheic dermatitis depends on its location on the body and the person's age. Seborrheic dermatitis of the scalp (dandruff) in adults and teenagers is usually treated with a medicated shampoo.  Here is a list of the medications that help, and the over-counter shampoos that contain them:  Salicylic acid (Neutrogena T/Sal, Sebulex, Scalpicin, Denorex Extra Strength)  Zinc pyrithione (Head & Shoulders white bottle, Denorex Daily, DHS Zinc, Pantene Pro-V Pyrithione Zinc)  Selenium sulfide (Head & Shoulders blue bottle, Selsun Blue, Exsel Lotion Shampoo, Glo-Sel)  Yahoo tar (Neutrogena T/Gal, Pentrax, Zetar, Tegrin, Viacom, Therapeutic Denorex)  Ketoconazole (Nizoral)  If you have dandruff, you might  start by using one of these shampoos every day until your dandruff is controlled and then keep using it at least twice a week.  Often times your doctor will recommend a rotation of several different medicated shampoos as some will experience a plateau in the effectiveness of any one shampoo.   When you use a dandruff shampoo, rub the shampoo into your wet hair and massage into scalp thoroughly.  Let it stay on your hair and scalp for 5 minutes before rinsing.  If you have involvement in the eyebrows or face, you can lather those areas with the medicated shampoo as well, or use a medicated soap (ZNP-bar, Polytar Soap, SAStid, or sulfur soap).    If the wash or shampoo alone does not help, your doctor might want you to use a prescription medication once or twice a day.  Leave-in medications for the scalp are best applied by massaging into the scalp immediately after towel drying your hair, but may be applied even if you have not washed your hair.  Seborrheic dermatitis in infants usually clears up by age 71 -66 months.  It may develop in the  diaper area where it might be confused with diaper rash.  For milder cases you can try gently brushing out scales with a soft brush.  This is best done immediately after washing with a non-medicated baby shampoo Wynetta Emery and Royce Macadamia, etc.).  Your doctor may recommend a medicated shampoo or a prescription topical medication.   Topical steroids (such as triamcinolone, fluocinolone, fluocinonide, mometasone, clobetasol, halobetasol, betamethasone, hydrocortisone) can cause thinning and lightening of the skin if they are used for too long in the same area. Your physician has selected the right strength medicine for your problem and area affected on the body. Please use your medication only as directed by your physician to prevent side effects.  for clobetasol - Avoid applying to face, groin, and axilla. Use as directed. Risk of skin atrophy with long-term use reviewed.     Recommend taking Heliocare sun protection supplement daily in sunny weather for additional sun protection. For maximum protection on the sunniest days, you can take up to 2 capsules of regular Heliocare OR take 1 capsule of Heliocare Ultra. For prolonged exposure (such as a full day in the sun), you can repeat your dose of the supplement 4 hours after your first dose. Heliocare can be purchased at [facility] or at [email or web address].

## 2021-02-09 NOTE — Progress Notes (Signed)
1. 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 Skin , mid upper abdomen MELANOCYTIC NEVUS, INTRADERMAL TYPE, IRRITATED  This is a NORMAL MOLE. No additional treatment is needed. If you notice any new or changing spots or have other skin concerns in future, please call our office at [phone].     Dr. Laurence Ferrari discussed results and treatment plan with patient. We will plan to do a shave removal of the other darker mole on her upper back on the surgery day or at suture removal.  MAs please call to schedule for excision (pt will be available before 9:30 or after 11 on 02/10/2021) and for 3 month FBSE. Thank you!

## 2021-02-10 ENCOUNTER — Telehealth: Payer: Self-pay

## 2021-02-10 NOTE — Telephone Encounter (Signed)
-----   Message from Alfonso Patten, MD sent at 02/09/2021  5:16 PM EST ----- 1. 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 Skin , mid upper abdomen MELANOCYTIC NEVUS, INTRADERMAL TYPE, IRRITATED  This is a NORMAL MOLE. No additional treatment is needed. If you notice any new or changing spots or have other skin concerns in future, please call our office at [phone].     Dr. Laurence Ferrari discussed results and treatment plan with patient. We will plan to do a shave removal of the other darker mole on her upper back on the surgery day or at suture removal.  MAs please call to schedule for excision (pt will be available before 9:30 or after 11 on 02/10/2021) and for 3 month FBSE. Thank you!

## 2021-02-10 NOTE — Telephone Encounter (Signed)
Patient scheduled for excision of MMis left upper arm 02/23/21 at 11am. FBSE scheduled for 05/12/21 at 8:45am, JS

## 2021-02-23 ENCOUNTER — Other Ambulatory Visit: Payer: Self-pay | Admitting: Dermatology

## 2021-02-23 ENCOUNTER — Ambulatory Visit (INDEPENDENT_AMBULATORY_CARE_PROVIDER_SITE_OTHER): Payer: BC Managed Care – PPO | Admitting: Dermatology

## 2021-02-23 ENCOUNTER — Other Ambulatory Visit: Payer: Self-pay

## 2021-02-23 ENCOUNTER — Encounter: Payer: Self-pay | Admitting: Dermatology

## 2021-02-23 DIAGNOSIS — L700 Acne vulgaris: Secondary | ICD-10-CM | POA: Diagnosis not present

## 2021-02-23 DIAGNOSIS — D0362 Melanoma in situ of left upper limb, including shoulder: Secondary | ICD-10-CM | POA: Diagnosis not present

## 2021-02-23 DIAGNOSIS — D485 Neoplasm of uncertain behavior of skin: Secondary | ICD-10-CM

## 2021-02-23 MED ORDER — MUPIROCIN 2 % EX OINT: 1.0000 "application " | TOPICAL_OINTMENT | Freq: Every day | CUTANEOUS | 0 refills | Status: AC

## 2021-02-23 MED ORDER — CLINDAMYCIN PHOS-BENZOYL PEROX 1-5 % EX GEL: CUTANEOUS | 2 refills | Status: AC

## 2021-02-23 NOTE — Progress Notes (Signed)
Follow-Up Visit   Subjective  Cynthia [name] is a 39 y.o. 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   The following portions of the chart were reviewed this encounter and updated as appropriate:   Tobacco  Allergies  Meds  Problems  Med Hx  Surg Hx  Fam Hx      Review of Systems:  No other skin or systemic complaints except as noted in HPI or Assessment and Plan.  Objective  Well appearing patient in no apparent distress; mood and affect are within normal limits.  A focused examination was performed including left upper arm. Relevant physical exam findings are noted in the Assessment and Plan.  Objective  Left Upper Arm: Healing bx site   Objective  face: Scattered inflammatory papules and pustules at lower face   Assessment & Plan  Neoplasm of uncertain behavior of skin Left Upper Arm  Skin excision  Lesion length (cm):  1.1 Lesion width (cm):  1.1 Margin per side (cm):  0.5 Total excision diameter (cm):  2.1 Informed consent: discussed and consent obtained   Timeout: patient name, date of birth, surgical site, and procedure verified   Procedure prep:  Patient was prepped and draped in usual sterile fashion Prep type:  Chlorhexidine Anesthesia: the lesion was anesthetized in a standard fashion   Anesthesia comment:  16cc Anesthetic:  1% lidocaine w/ epinephrine 1-100,000 buffered w/ 8.4% NaHCO3 Instrument used: #15 blade   Hemostasis achieved with: suture, pressure and electrodesiccation   Outcome: patient tolerated procedure well with no complications   Post-procedure details: wound care instructions given   Additional details:  Mupirocin and a pressure dressing applied  Skin repair Complexity:  Complex Final length (cm):  6.5 Informed consent: discussed and consent obtained   Timeout: patient name, date of birth, surgical site,  and procedure verified   Procedure prep:  Patient was prepped and draped in usual sterile fashion Prep type:  Chlorhexidine Anesthesia: the lesion was anesthetized in a standard fashion   Anesthetic:  1% lidocaine w/ epinephrine 1-100,000 local infiltration Reason for type of repair: reduce tension to allow closure, reduce the risk of dehiscence, infection, and necrosis, reduce subcutaneous dead space and avoid a hematoma, allow closure of the large defect, allow side-to-side closure without requiring a flap or graft and enhance both functionality and cosmetic results   Undermining: area extensively undermined   Subcutaneous layers (deep stitches):  Suture size:  3-0 Suture type: Vicryl (polyglactin 910)   Stitches:  Buried vertical mattress Fine/surface layer approximation (top stitches):  Suture size:  4-0 Suture type: nylon   Suture removal (days):  7 Hemostasis achieved with: pressure and electrodesiccation Outcome: patient tolerated procedure well with no complications   Post-procedure details: wound care instructions given   Additional details:  Extensive undermining greater than the maximum width of the defect along at least one entire edge of the defect was performed Maximum width of defect perpendicular to the line of the closure 2.0 cm Width of undermining done 2.4 cm  Mupirocin and a pressure bandage applied   Specimen 1 - Surgical pathology Differential Diagnosis: 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   Benzoyl peroxide can cause  dryness and irritation of the skin. It can also bleach fabric. When used together with Aczone (dapsone) cream, it can stain the skin orange.  Chronic condition with duration over one year. Condition is bothersome to patient. Currently  flared. Flares typically before menses.    Ordered Medications: clindamycin-benzoyl peroxide (BENZACLIN) gel  Return in about 1 week (around 03/02/2021) for Suture Removal and Shave Removal of nevus.  Graciella Belton, RMA, am acting as scribe for Forest Gleason, MD .   Documentation: I have reviewed the above documentation for accuracy and completeness, and I agree with the above.  Forest Gleason, MD

## 2021-02-23 NOTE — Patient Instructions (Addendum)
Wound Care Instructions  1. Cleanse wound gently with soap and water once a day then pat dry with clean gauze. Apply a thing coat of Petrolatum (petroleum jelly, "Vaseline") over the wound (unless you have an allergy to this). We recommend that you use a new, sterile tube of Vaseline. Do not pick or remove scabs. Do not remove the yellow or white "healing tissue" from the base of the wound.  2. Cover the wound with fresh, clean, nonstick gauze and secure with paper tape. You may use Band-Aids in place of gauze and tape if the would is small enough, but would recommend trimming much of the tape off as there is often too much. Sometimes Band-Aids can irritate the skin.  3. You should call the office for your biopsy report after 1 week if you have not already been contacted.  4. If you experience any problems, such as abnormal amounts of bleeding, swelling, significant bruising, significant pain, or evidence of infection, please call the office immediately.  5. FOR ADULT SURGERY PATIENTS: If you need something for pain relief you may take 1 extra strength Tylenol (acetaminophen) AND 2 Ibuprofen (200mg  each) together every 4 hours as needed for pain. (do not take these if you are allergic to them or if you have a reason you should not take them.) Typically, you may only need pain medication for 1 to 3 days.    If you have an issue when the clinic is closed that cannot wait until the next business day, you can page your doctor at the number below.   Please note that while we do our best to be available for urgent issues outside of office hours, we are not available 24/7.  If you have a medical emergency and do not hear back from your doctor promptly, please seek medical care at your doctor's office, retail clinic, urgent care center or emergency room.  Pager Numbers  Dr. Nehemiah Massed: [phone]  Dr. Laurence Ferrari: [phone]  Dr. Nicole Kindred: [phone]   Benzoyl peroxide can cause dryness and irritation  of the skin. It can also bleach fabric. When used together with Aczone (dapsone) cream, it can stain the skin orange.   Recommend taking Heliocare sun protection supplement daily in sunny weather for additional sun protection. For maximum protection on the sunniest days, you can take up to 2 capsules of regular Heliocare OR take 1 capsule of Heliocare Ultra. For prolonged exposure (such as a full day in the sun), you can repeat your dose of the supplement 4 hours after your first dose. Heliocare can be purchased at [facility] or at [email or web address].

## 2021-02-24 ENCOUNTER — Telehealth: Payer: Self-pay

## 2021-02-24 NOTE — Telephone Encounter (Signed)
Patient doing well after yesterdays surgery, JS 

## 2021-03-02 ENCOUNTER — Ambulatory Visit (INDEPENDENT_AMBULATORY_CARE_PROVIDER_SITE_OTHER): Payer: BC Managed Care – PPO | Admitting: Dermatology

## 2021-03-02 ENCOUNTER — Other Ambulatory Visit: Payer: Self-pay

## 2021-03-02 ENCOUNTER — Encounter: Payer: Self-pay | Admitting: Dermatology

## 2021-03-02 DIAGNOSIS — D225 Melanocytic nevi of trunk: Secondary | ICD-10-CM | POA: Diagnosis not present

## 2021-03-02 DIAGNOSIS — D485 Neoplasm of uncertain behavior of skin: Secondary | ICD-10-CM

## 2021-03-02 DIAGNOSIS — Z4802 Encounter for removal of sutures: Secondary | ICD-10-CM

## 2021-03-02 NOTE — Patient Instructions (Signed)
Wound Care Instructions  1. Cleanse wound gently with soap and water once a day then pat dry with clean gauze. Apply a thing coat of Petrolatum (petroleum jelly, "Vaseline") over the wound (unless you have an allergy to this). We recommend that you use a new, sterile tube of Vaseline. Do not pick or remove scabs. Do not remove the yellow or white "healing tissue" from the base of the wound.  2. Cover the wound with fresh, clean, nonstick gauze and secure with paper tape. You may use Band-Aids in place of gauze and tape if the would is small enough, but would recommend trimming much of the tape off as there is often too much. Sometimes Band-Aids can irritate the skin.  3. You should call the office for your biopsy report after 1 week if you have not already been contacted.  4. If you experience any problems, such as abnormal amounts of bleeding, swelling, significant bruising, significant pain, or evidence of infection, please call the office immediately.  5. FOR ADULT SURGERY PATIENTS: If you need something for pain relief you may take 1 extra strength Tylenol (acetaminophen) AND 2 Ibuprofen (200mg  each) together every 4 hours as needed for pain. (do not take these if you are allergic to them or if you have a reason you should not take them.) Typically, you may only need pain medication for 1 to 3 days.     Recommend Serica moisturizing scar formula cream every night or [employer] brand or Mederma silicone scar sheet every night for the first year after a scar appears to help with scar remodeling if desired. Scars remodel on their own for a full year.

## 2021-03-02 NOTE — Progress Notes (Signed)
   Follow-Up Visit   Subjective  Cynthia [name] is a 40 y.o. female who presents for the following: Follow-up (Patient here today for suture removal of MMis at left upper arm. Patient also here to have nevus biopsied at upper back left of midline.).  The following portions of the chart were reviewed this encounter and updated as appropriate:   Tobacco  Allergies  Meds  Problems  Med Hx  Surg Hx  Fam Hx      Review of Systems:  No other skin or systemic complaints except as noted in HPI or Assessment and Plan.  Objective  Well appearing patient in no apparent distress; mood and affect are within normal limits.  A focused examination was performed including back ,left arm. Relevant physical exam findings are noted in the Assessment and Plan.  Objective  Upper Back left of midline: 0.25cm dark brown thin papule   Assessment & Plan  Neoplasm of uncertain behavior of skin Upper Back left of midline  Epidermal / dermal shaving  Lesion diameter (cm):  0.3 Informed consent: discussed and consent obtained   Timeout: patient name, date of birth, surgical site, and procedure verified   Patient was prepped and draped in usual sterile fashion: area prepped with isopropyl alcohol. Anesthesia: the lesion was anesthetized in a standard fashion   Anesthetic:  1% lidocaine w/ epinephrine 1-100,000 buffered w/ 8.4% NaHCO3 Instrument used: flexible razor blade   Hemostasis achieved with: aluminum chloride   Outcome: patient tolerated procedure well   Post-procedure details: wound care instructions given   Additional details:  Mupirocin and a bandage applied  Specimen 1 - Surgical pathology Differential Diagnosis: r/o Atypia  Check Margins: No 0.25cm dark brown thin papule  Other Related Medications mupirocin ointment (BACTROBAN) 2 %   Encounter for Removal of Sutures - Incision site at the left upper arm is clean, dry and intact - Wound cleansed, sutures removed, wound cleansed  and steri strips applied.  - Discussed pathology results showing margins free  - Patient advised to keep steri-strips dry until they fall off. - Scars remodel for a full year. - Once steri-strips fall off, patient can apply over-the-counter silicone scar cream each night to help with scar remodeling if desired. - Patient advised to call with any concerns or if they notice any new or changing lesions.   Return in about 6 months (around 09/02/2021) for TBSE.  Graciella Belton, RMA, am acting as scribe for Forest Gleason, MD .  Documentation: I have reviewed the above documentation for accuracy and completeness, and I agree with the above.  Forest Gleason, MD

## 2021-03-03 DIAGNOSIS — D239 Other benign neoplasm of skin, unspecified: Secondary | ICD-10-CM

## 2021-03-03 HISTORY — DX: Other benign neoplasm of skin, unspecified: D23.9

## 2021-03-04 ENCOUNTER — Telehealth: Payer: Self-pay

## 2021-03-04 DIAGNOSIS — D239 Other benign neoplasm of skin, unspecified: Secondary | ICD-10-CM

## 2021-03-04 HISTORY — DX: Other benign neoplasm of skin, unspecified: D23.9

## 2021-03-04 NOTE — Telephone Encounter (Signed)
Patient advised bx showed dysplastic nevus with severe atypia. Scheduled for surgery 03/23/21 at 9am, Cynthia [name]

## 2021-03-17 ENCOUNTER — Ambulatory Visit: Payer: BC Managed Care – PPO | Admitting: Dermatology

## 2021-03-23 ENCOUNTER — Other Ambulatory Visit: Payer: Self-pay

## 2021-03-23 ENCOUNTER — Ambulatory Visit (INDEPENDENT_AMBULATORY_CARE_PROVIDER_SITE_OTHER): Payer: BC Managed Care – PPO | Admitting: Dermatology

## 2021-03-23 DIAGNOSIS — D485 Neoplasm of uncertain behavior of skin: Secondary | ICD-10-CM | POA: Diagnosis not present

## 2021-03-23 NOTE — Patient Instructions (Addendum)

## 2021-03-23 NOTE — Progress Notes (Signed)
   Follow-Up Visit   Subjective  Cynthia [name] is a 40 y.o. 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   The following portions of the chart were reviewed this encounter and updated as appropriate:   Tobacco  Allergies  Meds  Problems  Med Hx  Surg Hx  Fam Hx      Review of Systems:  No other skin or systemic complaints except as noted in HPI or Assessment and Plan.  Objective  Well appearing patient in no apparent distress; mood and affect are within normal limits.  A focused examination was performed including left arm, back. Relevant physical exam findings are noted in the Assessment and Plan.  Objective  upper back left of midline: Healing bx site   Assessment & Plan  Neoplasm of uncertain behavior of skin upper back left of midline  Skin excision  Lesion length (cm):  0.7 Lesion width (cm):  0.7 Margin per side (cm):  0.5 Total excision diameter (cm):  1.7 Informed consent: discussed and consent obtained   Timeout: patient name, date of birth, surgical site, and procedure verified   Procedure prep:  Patient was prepped and draped in usual sterile fashion Prep type:  Chlorhexidine Anesthesia: the lesion was anesthetized in a standard fashion   Local anesthetic: 10cc of 2% lidocaine. Instrument used: #15 blade   Hemostasis achieved with: suture, pressure and electrodesiccation   Outcome: patient tolerated procedure well with no complications   Post-procedure details: wound care instructions given   Additional details:  Mupirocin and a pressure dressing applied  Skin repair Complexity:  Intermediate Final length (cm):  5.4 Informed consent: discussed and consent obtained   Timeout: patient name, date of birth, surgical site, and procedure verified   Procedure prep:  Patient was prepped and draped in usual sterile fashion Prep type:   Chlorhexidine Anesthesia: the lesion was anesthetized in a standard fashion   Anesthetic:  1% lidocaine w/ epinephrine 1-100,000 local infiltration Reason for type of repair: reduce tension to allow closure, reduce the risk of dehiscence, infection, and necrosis, reduce subcutaneous dead space and avoid a hematoma, allow closure of the large defect, allow side-to-side closure without requiring a flap or graft and enhance both functionality and cosmetic results   Undermining: area extensively undermined   Subcutaneous layers (deep stitches):  Suture size:  3-0 Suture type: Vicryl (polyglactin 910)   Stitches:  Buried vertical mattress Fine/surface layer approximation (top stitches):  Suture size:  4-0 Suture type: nylon   Suture removal (days):  7 Hemostasis achieved with: pressure and electrodesiccation Outcome: patient tolerated procedure well with no complications   Post-procedure details: wound care instructions given   Additional details:  Mupirocin and a pressure bandage applied   Specimen 1 - Surgical pathology Differential Diagnosis: 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  Graciella Belton, RMA, am acting as scribe for Forest Gleason, MD .  Documentation: I have reviewed the above documentation for accuracy and completeness, and I agree with the above.  Forest Gleason, MD

## 2021-03-24 ENCOUNTER — Telehealth: Payer: Self-pay

## 2021-03-24 NOTE — Telephone Encounter (Signed)
-----   Message from Graciella Belton, [place] sent at 03/23/2021 12:53 PM EDT ----- Regarding: post op

## 2021-03-24 NOTE — Telephone Encounter (Signed)
Called patient to check on her following yesterday's surgery. No answer, LVM, JS

## 2021-03-31 ENCOUNTER — Other Ambulatory Visit: Payer: Self-pay

## 2021-03-31 ENCOUNTER — Ambulatory Visit (INDEPENDENT_AMBULATORY_CARE_PROVIDER_SITE_OTHER): Payer: BC Managed Care – PPO | Admitting: Dermatology

## 2021-03-31 DIAGNOSIS — L239 Allergic contact dermatitis, unspecified cause: Secondary | ICD-10-CM

## 2021-03-31 DIAGNOSIS — Z4802 Encounter for removal of sutures: Secondary | ICD-10-CM

## 2021-03-31 DIAGNOSIS — L2489 Irritant contact dermatitis due to other agents: Secondary | ICD-10-CM

## 2021-03-31 MED ORDER — TRIAMCINOLONE ACETONIDE 0.1 % EX CREA: 1.0000 "application " | TOPICAL_CREAM | Freq: Two times a day (BID) | CUTANEOUS | 0 refills | Status: AC | PRN

## 2021-03-31 NOTE — Patient Instructions (Addendum)
Start TMC 0.1% cream twice daily for up to 2 weeks as needed for rash at back. Avoid applying to face, groin, and axilla. Use as directed. Risk of skin atrophy with long-term use reviewed.   Topical steroids (such as triamcinolone, fluocinolone, fluocinonide, mometasone, clobetasol, halobetasol, betamethasone, hydrocortisone) can cause thinning and lightening of the skin if they are used for too long in the same area. Your physician has selected the right strength medicine for your problem and area affected on the body. Please use your medication only as directed by your physician to prevent side effects.   Recommend Serica moisturizing scar formula cream every night or [facility] brand or Mederma silicone scar sheet every night for the first year after a scar appears to help with scar remodeling if desired. Scars remodel on their own for a full year.  If you have any questions or concerns for your doctor, please call our main line at [phone] and press option 4 to reach your doctor's medical assistant. If no one answers, please leave a voicemail as directed and we will return your call as soon as possible. Messages left after 4 pm will be answered the following business day.   You may also send [place] a message via [place]. We typically respond to MyChart messages within 1-2 business days.  For prescription refills, please ask your pharmacy to contact our office. Our fax number is [phone].  If you have an urgent issue when the clinic is closed that cannot wait until the next business day, you can page your doctor at the number below.    Please note that while we do our best to be available for urgent issues outside of office hours, we are not available 24/7.   If you have an urgent issue and are unable to reach [place], you may choose to seek medical care at your doctor's office, retail clinic, urgent care center, or emergency room.  If you have a medical emergency, please immediately call 911 or  go to the emergency department.  Pager Numbers  - Dr. Nehemiah Massed: [phone]  - Dr. Laurence Ferrari: [phone]  - Dr. Nicole Kindred: [phone]  In the event of inclement weather, please call our main line at [phone] for an update on the status of any delays or closures.  Dermatology Medication Tips: Please keep the boxes that topical medications come in in order to help keep track of the instructions about where and how to use these. Pharmacies typically print the medication instructions only on the boxes and not directly on the medication tubes.   If your medication is too expensive, please contact our office at [phone] option 4 or send [place] a message through [place].   We are unable to tell what your co-pay for medications will be in advance as this is different depending on your insurance coverage. However, we may be able to find a substitute medication at lower cost or fill out paperwork to get insurance to cover a needed medication.   If a prior authorization is required to get your medication covered by your insurance company, please allow [place] 1-2 business days to complete this process.  Drug prices often vary depending on where the prescription is filled and some pharmacies may offer cheaper prices.  The website www.goodrx.com contains coupons for medications through different pharmacies. The prices here do not account for what the cost may be with help from insurance (it may be cheaper with your insurance), but the website can give you the price if you did  not use any insurance.  - You can print the associated coupon and take it with your prescription to the pharmacy.  - You may also stop by our office during regular business hours and pick up a GoodRx coupon card.  - If you need your prescription sent electronically to a different pharmacy, notify our office through [facility] or by phone at [phone] option 4.

## 2021-03-31 NOTE — Progress Notes (Signed)
   Follow-Up Visit   Subjective  Cynthia [name] is a 40 y.o. female who presents for the following: Follow-up (Patient here today for suture removal at upper back left of midline.).   The following portions of the chart were reviewed this encounter and updated as appropriate:   Tobacco  Allergies  Meds  Problems  Med Hx  Surg Hx  Fam Hx     Review of Systems:  No other skin or systemic complaints except as noted in HPI or Assessment and Plan.  Objective  Well appearing patient in no apparent distress; mood and affect are within normal limits.  A focused examination was performed including back. Relevant physical exam findings are noted in the Assessment and Plan.  Objective  Mid Back: Scaly pink plaques   Assessment & Plan  Irritant contact dermatitis due to other agents Mid Back  Start TMC 0.1% cream BID for up to 2 weeks PRN rash at back. Avoid applying to face, groin, and axilla. Use as directed. Risk of skin atrophy with long-term use reviewed.   Topical steroids (such as triamcinolone, fluocinolone, fluocinonide, mometasone, clobetasol, halobetasol, betamethasone, hydrocortisone) can cause thinning and lightening of the skin if they are used for too long in the same area. Your physician has selected the right strength medicine for your problem and area affected on the body. Please use your medication only as directed by your physician to prevent side effects.    Ordered Medications: triamcinolone cream (KENALOG) 0.1 %   Encounter for Removal of Sutures - Incision site at the upper back left of midline is clean, dry and intact - Wound cleansed, sutures removed, wound cleansed and steri strips applied.  - Discussed pathology results showing margins free  - Patient advised to keep steri-strips dry until they fall off. - Scars remodel for a full year. - Once steri-strips fall off, patient can apply over-the-counter silicone scar cream each night to help with scar  remodeling if desired. - Patient advised to call with any concerns or if they notice any new or changing lesions.   Return for as scheduled.  Graciella Belton, RMA, am acting as scribe for Forest Gleason, MD .  Documentation: I have reviewed the above documentation for accuracy and completeness, and I agree with the above.  Forest Gleason, MD

## 2021-04-08 ENCOUNTER — Encounter: Payer: Self-pay | Admitting: Dermatology

## 2021-04-20 ENCOUNTER — Ambulatory Visit: Payer: BC Managed Care – PPO | Admitting: Dermatology

## 2021-05-03 ENCOUNTER — Encounter: Payer: Self-pay | Admitting: Dermatology

## 2021-05-04 ENCOUNTER — Ambulatory Visit (INDEPENDENT_AMBULATORY_CARE_PROVIDER_SITE_OTHER): Payer: BC Managed Care – PPO | Admitting: Dermatology

## 2021-05-04 ENCOUNTER — Encounter: Payer: Self-pay | Admitting: Dermatology

## 2021-05-04 ENCOUNTER — Other Ambulatory Visit: Payer: Self-pay

## 2021-05-04 DIAGNOSIS — S2190XA Unspecified open wound of unspecified part of thorax, initial encounter: Secondary | ICD-10-CM

## 2021-05-04 DIAGNOSIS — T148XXA Other injury of unspecified body region, initial encounter: Secondary | ICD-10-CM

## 2021-05-04 NOTE — Patient Instructions (Signed)

## 2021-05-04 NOTE — Progress Notes (Signed)
   Follow-Up Visit   Subjective  Cynthia [name] is a 40 y.o. female who presents for the following: Wound Check (Pt c/o open wound on her incision site scar. She states that this just happened within the last few days. ).  The following portions of the chart were reviewed this encounter and updated as appropriate:  Tobacco  Allergies  Meds  Problems  Med Hx  Surg Hx  Fam Hx      Review of Systems: No other skin or systemic complaints except as noted in HPI or Assessment and Plan.   Objective  Well appearing patient in no apparent distress; mood and affect are within normal limits.  A focused examination was performed including back. Relevant physical exam findings are noted in the Assessment and Plan.  Objective  Left Upper Back: Small focal open wound  Assessment & Plan  Open wound Left Upper Back  S/p excision for severely atypical nevus  Focal dehiscence >1 month after surgery  Will send culture today.  Start Mupirocin ointment. Apply daily to area until completely healed. Pt has.   Given timeline, would not recommend suturing site due to risk of infection. Recommend healing by secondary intent.  Pt will call if getting worse.    Anaerobic and Aerobic Culture - Left Upper Back  Keep scheduled appointment in September  I, Harriett Sine, CMA, am acting as scribe for Forest Gleason, MD.  Documentation: I have reviewed the above documentation for accuracy and completeness, and I agree with the above.  Forest Gleason, MD

## 2021-05-04 NOTE — Telephone Encounter (Signed)
Patient states a small area of scar has turned purple and after taking a shower her husband noticed that area looked opened and had pus draining from it. Patient doesn't think it's infected but is concerned that site has opened opened up. She is going to try to upload a picture to Mychart when she has the chance. Please advise.

## 2021-05-09 ENCOUNTER — Encounter: Payer: Self-pay | Admitting: Dermatology

## 2021-05-09 LAB — ANAEROBIC AND AEROBIC CULTURE

## 2021-05-12 ENCOUNTER — Ambulatory Visit: Payer: BC Managed Care – PPO | Admitting: Dermatology

## 2021-09-09 ENCOUNTER — Other Ambulatory Visit: Payer: Self-pay

## 2021-09-09 ENCOUNTER — Ambulatory Visit (INDEPENDENT_AMBULATORY_CARE_PROVIDER_SITE_OTHER): Payer: BC Managed Care – PPO | Admitting: Dermatology

## 2021-09-09 DIAGNOSIS — L821 Other seborrheic keratosis: Secondary | ICD-10-CM

## 2021-09-09 DIAGNOSIS — Z86018 Personal history of other benign neoplasm: Secondary | ICD-10-CM

## 2021-09-09 DIAGNOSIS — Z86006 Personal history of melanoma in-situ: Secondary | ICD-10-CM | POA: Diagnosis not present

## 2021-09-09 DIAGNOSIS — L219 Seborrheic dermatitis, unspecified: Secondary | ICD-10-CM

## 2021-09-09 DIAGNOSIS — D1800 Hemangioma unspecified site: Secondary | ICD-10-CM

## 2021-09-09 DIAGNOSIS — L988 Other specified disorders of the skin and subcutaneous tissue: Secondary | ICD-10-CM

## 2021-09-09 DIAGNOSIS — D229 Melanocytic nevi, unspecified: Secondary | ICD-10-CM

## 2021-09-09 DIAGNOSIS — L578 Other skin changes due to chronic exposure to nonionizing radiation: Secondary | ICD-10-CM

## 2021-09-09 DIAGNOSIS — Z1283 Encounter for screening for malignant neoplasm of skin: Secondary | ICD-10-CM

## 2021-09-09 DIAGNOSIS — L905 Scar conditions and fibrosis of skin: Secondary | ICD-10-CM

## 2021-09-09 DIAGNOSIS — L814 Other melanin hyperpigmentation: Secondary | ICD-10-CM

## 2021-09-09 NOTE — Patient Instructions (Addendum)
Continue ketoconazole 2% shampoo as needed Continue clobetasol solution 1-2 times daily as needed for itch. Avoid applying to face, groin, and axilla. Use as directed. Risk of skin atrophy with long-term use reviewed.   Topical steroids (such as triamcinolone, fluocinolone, fluocinonide, mometasone, clobetasol, halobetasol, betamethasone, hydrocortisone) can cause thinning and lightening of the skin if they are used for too long in the same area. Your physician has selected the right strength medicine for your problem and area affected on the body. Please use your medication only as directed by your physician to prevent side effects.   Will prescribe [facility] Anti-Aging Tretinoin 0.025%/Niacinamide/Vitamin C/Vitamin E/Turmeric/Resveratrol with Hyaluronic Acid. Apply pea sized amount nightly to the entire face.  The patient was advised this is not covered by insurance since it is made by a compounding pharmacy. They will receive an email to check out and the medication will be mailed to their home.   Topical retinoid medications like tretinoin can cause dryness and irritation when first started. Only apply a pea-sized amount to the entire affected area. Avoid applying it around the eyes, edges of mouth and creases at the nose. If you experience irritation, use a good moisturizer first and/or apply the medicine less often. If you are doing well with the medicine, you can increase how often you use it until you are applying every night. Be careful with sun protection while using this medication as it can make you sensitive to the sun. This medicine should not be used by pregnant women.   Instructions for [facility] Medications  One or more of your medications was sent to the [facility] mail order compounding pharmacy. You will receive an email from them and can purchase the medicine through that link. It will then be mailed to your home at the address you confirmed. If for any reason you do  not receive an email from them, please check your spam folder. If you still do not find the email, please let us know. [facility] phone number is [phone].   Melanoma ABCDEs  Melanoma is the most dangerous type of skin cancer, and is the leading cause of death from skin disease.  You are more likely to develop melanoma if you: Have light-colored skin, light-colored eyes, or red or blond hair Spend a lot of time in the sun Tan regularly, either outdoors or in a tanning bed Have had blistering sunburns, especially during childhood Have a close family member who has had a melanoma Have atypical moles or large birthmarks  Early detection of melanoma is key since treatment is typically straightforward and cure rates are extremely high if we catch it early.   The first sign of melanoma is often a change in a mole or a new dark spot.  The ABCDE system is a way of remembering the signs of melanoma.  A for asymmetry:  The two halves do not match. B for border:  The edges of the growth are irregular. C for color:  A mixture of colors are present instead of an even brown color. D for diameter:  Melanomas are usually (but not always) greater than 23mm - the size of a pencil eraser. E for evolution:  The spot keeps changing in size, shape, and color.  Please check your skin once per month between visits. You can use a small mirror in front and a large mirror behind you to keep an eye on the back side or your body.   If you see any new  or changing lesions before your next follow-up, please call to schedule a visit.  Please continue daily skin protection including broad spectrum sunscreen SPF 30+ to sun-exposed areas, reapplying every 2 hours as needed when you're outdoors.    Recommend taking Heliocare sun protection supplement daily in sunny weather for additional sun protection. For maximum protection on the sunniest days, you can take up to 2 capsules of regular Heliocare OR take 1 capsule  of Heliocare Ultra. For prolonged exposure (such as a full day in the sun), you can repeat your dose of the supplement 4 hours after your first dose. Heliocare can be purchased at [facility] or at [email or web address].    If you have any questions or concerns for your doctor, please call our main line at [phone] and press option 4 to reach your doctor's medical assistant. If no one answers, please leave a voicemail as directed and we will return your call as soon as possible. Messages left after 4 pm will be answered the following business day.   You may also send [place] a message via [place]. We typically respond to MyChart messages within 1-2 business days.  For prescription refills, please ask your pharmacy to contact our office. Our fax number is [phone].  If you have an urgent issue when the clinic is closed that cannot wait until the next business day, you can page your doctor at the number below.    Please note that while we do our best to be available for urgent issues outside of office hours, we are not available 24/7.   If you have an urgent issue and are unable to reach [place], you may choose to seek medical care at your doctor's office, retail clinic, urgent care center, or emergency room.  If you have a medical emergency, please immediately call 911 or go to the emergency department.  Pager Numbers  - Dr. Nehemiah Massed: [phone]  - Dr. Laurence Ferrari: [phone]  - Dr. Nicole Kindred: [phone]  In the event of inclement weather, please call our main line at [phone] for an update on the status of any delays or closures.  Dermatology Medication Tips: Please keep the boxes that topical medications come in in order to help keep track of the instructions about where and how to use these. Pharmacies typically print the medication instructions only on the boxes and not directly on the medication tubes.   If your medication is too expensive, please contact our office at  [phone] option 4 or send [place] a message through [place].   We are unable to tell what your co-pay for medications will be in advance as this is different depending on your insurance coverage. However, we may be able to find a substitute medication at lower cost or fill out paperwork to get insurance to cover a needed medication.   If a prior authorization is required to get your medication covered by your insurance company, please allow [place] 1-2 business days to complete this process.  Drug prices often vary depending on where the prescription is filled and some pharmacies may offer cheaper prices.  The website www.goodrx.com contains coupons for medications through different pharmacies. The prices here do not account for what the cost may be with help from insurance (it may be cheaper with your insurance), but the website can give you the price if you did not use any insurance.  - You can print the associated coupon and take it with your prescription to the pharmacy.  - You may also  stop by our office during regular business hours and pick up a GoodRx coupon card.  - If you need your prescription sent electronically to a different pharmacy, notify our office through [facility] or by phone at [phone] option 4.  Recommended non-comedogenic (non-acne causing) facial oils include 100% argan oil or squalane. The can be used after applying any recommended creams or ointments to the skin in the evening. [employer] has a high-quality and affordable version of both of these and can be found at [place].

## 2021-09-09 NOTE — Progress Notes (Signed)
Follow-Up Visit   Subjective  Cynthia [name] is a 40 y.o. female who presents for the following: FBSE (Patient here for full body skin exam and skin cancer screening. Patient with hx of dysplastic nevi and melanoma in situ. She is not aware of any new or changing spots. ).   The following portions of the chart were reviewed this encounter and updated as appropriate:   Tobacco  Allergies  Meds  Problems  Med Hx  Surg Hx  Fam Hx      Review of Systems:  No other skin or systemic complaints except as noted in HPI or Assessment and Plan.  Objective  Well appearing patient in no apparent distress; mood and affect are within normal limits.  A full examination was performed including scalp, head, eyes, ears, nose, lips, neck, chest, axillae, abdomen, back, buttocks, bilateral upper extremities, bilateral lower extremities, hands, feet, fingers, toes, fingernails, and toenails. All findings within normal limits unless otherwise noted below.  Scalp Mild scale  Upper Back left of midline, left upper arm Dyspigmented smooth macule or patch.        Head - Anterior (Face) Rhytides and volume loss.    Assessment & Plan  Seborrheic dermatitis Scalp  Chronic condition with duration or expected duration over one year. Currently well-controlled.  Continue ketoconazole 2% shampoo as needed Continue clobetasol solution 1-2 times daily as needed for itch. Avoid applying to face, groin, and axilla. Use as directed. Risk of skin atrophy with long-term use reviewed.   Topical steroids (such as triamcinolone, fluocinolone, fluocinonide, mometasone, clobetasol, halobetasol, betamethasone, hydrocortisone) can cause thinning and lightening of the skin if they are used for too long in the same area. Your physician has selected the right strength medicine for your problem and area affected on the body. Please use your medication only as directed by your physician to prevent side effects.     Related Medications hydrocortisone 2.5 % cream Apply topically 2 (two) times daily. For affected areas of nose use for up to 2 weeks until clear. Then use only as needed  clobetasol (TEMOVATE) 0.05 % external solution Apply 1 application topically 2 (two) times daily as needed. For itch Apply to affected areas of scalp Avoid applying to face, groin, and axilla. Use as directed. Risk of skin atrophy with long-term use reviewed.  Scar Upper Back left of midline, left upper arm  S/p excision  Patient given sample of Strata triz scar therapy gel.  Elastosis of skin Head - Anterior (Face)  Will prescribe [facility] Anti-Aging Tretinoin 0.025%/Niacinamide/Vitamin C/Vitamin E/Turmeric/Resveratrol with Hyaluronic Acid. Apply pea sized amount nightly to the entire face.  The patient was advised this is not covered by insurance since it is made by a compounding pharmacy. They will receive an email to check out and the medication will be mailed to their home.   Topical retinoid medications like tretinoin can cause dryness and irritation when first started. Only apply a pea-sized amount to the entire affected area. Avoid applying it around the eyes, edges of mouth and creases at the nose. If you experience irritation, use a good moisturizer first and/or apply the medicine less often. If you are doing well with the medicine, you can increase how often you use it until you are applying every night. Be careful with sun protection while using this medication as it can make you sensitive to the sun. This medicine should not be used by pregnant women.    Lentigines - Scattered tan  macules - Due to sun exposure - Benign-appearing, observe - Recommend daily broad spectrum sunscreen SPF 30+ to sun-exposed areas, reapply every 2 hours as needed. - Call for any changes  Seborrheic Keratoses - Stuck-on, waxy, tan-brown papules and/or plaques  - Benign-appearing - Discussed benign etiology and  prognosis. - Observe - Call for any changes  Melanocytic Nevi - Tan-brown and/or pink-flesh-colored symmetric macules and papules - Benign appearing on exam today - Observation - Call clinic for new or changing moles - Recommend daily use of broad spectrum spf 30+ sunscreen to sun-exposed areas.   Hemangiomas - Red papules - Discussed benign nature - Observe - Call for any changes  Actinic Damage - Chronic condition, secondary to cumulative UV/sun exposure - diffuse scaly erythematous macules with underlying dyspigmentation - Recommend daily broad spectrum sunscreen SPF 30+ to sun-exposed areas, reapply every 2 hours as needed.  - Staying in the shade or wearing long sleeves, sun glasses (UVA+UVB protection) and wide brim hats (4-inch brim around the entire circumference of the hat) are also recommended for sun protection.  - Call for new or changing lesions.  Skin cancer screening performed today.  History of Dysplastic Nevi - No evidence of recurrence today at upper back left of midline - Recommend regular full body skin exams - Recommend daily broad spectrum sunscreen SPF 30+ to sun-exposed areas, reapply every 2 hours as needed.  - Call if any new or changing lesions are noted between office visits  History of Melanoma in Situ - No evidence of recurrence today at left upper arm - Recommend regular full body skin exams - Recommend daily broad spectrum sunscreen SPF 30+ to sun-exposed areas, reapply every 2 hours as needed.  - Call if any new or changing lesions are noted between office visits  Return for TBSE 4-6 months.  Graciella Belton, RMA, am acting as scribe for Forest Gleason, MD .  Documentation: I have reviewed the above documentation for accuracy and completeness, and I agree with the above.  Forest Gleason, MD

## 2021-09-14 ENCOUNTER — Encounter: Payer: Self-pay | Admitting: Dermatology

## 2022-02-10 ENCOUNTER — Ambulatory Visit: Payer: BC Managed Care – PPO | Admitting: Dermatology

## 2022-02-15 ENCOUNTER — Ambulatory Visit (INDEPENDENT_AMBULATORY_CARE_PROVIDER_SITE_OTHER): Payer: BC Managed Care – PPO | Admitting: Dermatology

## 2022-02-15 ENCOUNTER — Encounter: Payer: Self-pay | Admitting: Dermatology

## 2022-02-15 ENCOUNTER — Other Ambulatory Visit: Payer: Self-pay

## 2022-02-15 DIAGNOSIS — L578 Other skin changes due to chronic exposure to nonionizing radiation: Secondary | ICD-10-CM | POA: Diagnosis not present

## 2022-02-15 DIAGNOSIS — K130 Diseases of lips: Secondary | ICD-10-CM | POA: Diagnosis not present

## 2022-02-15 DIAGNOSIS — T148XXA Other injury of unspecified body region, initial encounter: Secondary | ICD-10-CM

## 2022-02-15 DIAGNOSIS — L814 Other melanin hyperpigmentation: Secondary | ICD-10-CM

## 2022-02-15 DIAGNOSIS — Z86006 Personal history of melanoma in-situ: Secondary | ICD-10-CM

## 2022-02-15 DIAGNOSIS — D229 Melanocytic nevi, unspecified: Secondary | ICD-10-CM

## 2022-02-15 DIAGNOSIS — Z1283 Encounter for screening for malignant neoplasm of skin: Secondary | ICD-10-CM | POA: Diagnosis not present

## 2022-02-15 DIAGNOSIS — D2371 Other benign neoplasm of skin of right lower limb, including hip: Secondary | ICD-10-CM

## 2022-02-15 DIAGNOSIS — L821 Other seborrheic keratosis: Secondary | ICD-10-CM

## 2022-02-15 DIAGNOSIS — S0120XA Unspecified open wound of nose, initial encounter: Secondary | ICD-10-CM

## 2022-02-15 DIAGNOSIS — Z86018 Personal history of other benign neoplasm: Secondary | ICD-10-CM

## 2022-02-15 DIAGNOSIS — D1800 Hemangioma unspecified site: Secondary | ICD-10-CM

## 2022-02-15 MED ORDER — HYDROCORTISONE 2.5 % EX CREA: TOPICAL_CREAM | CUTANEOUS | 0 refills | Status: AC

## 2022-02-15 NOTE — Patient Instructions (Addendum)
Start HC 2.5% cream twice daily for up to 2 weeks to affected areas at lips.   Recommend mupirocin to affected area at inside of nose three times daily.  Recommend taking Heliocare sun protection supplement daily in sunny weather for additional sun protection. For maximum protection on the sunniest days, you can take up to 2 capsules of regular Heliocare OR take 1 capsule of Heliocare Ultra. For prolonged exposure (such as a full day in the sun), you can repeat your dose of the supplement 4 hours after your first dose. Heliocare can be purchased at [employer], at some [facility] or at [email or web address].    Melanoma ABCDEs  Melanoma is the most dangerous type of skin cancer, and is the leading cause of death from skin disease.  You are more likely to develop melanoma if you: Have light-colored skin, light-colored eyes, or red or blond hair Spend a lot of time in the sun Tan regularly, either outdoors or in a tanning bed Have had blistering sunburns, especially during childhood Have a close family member who has had a melanoma Have atypical moles or large birthmarks  Early detection of melanoma is key since treatment is typically straightforward and cure rates are extremely high if we catch it early.   The first sign of melanoma is often a change in a mole or a new dark spot.  The ABCDE system is a way of remembering the signs of melanoma.  A for asymmetry:  The two halves do not match. B for border:  The edges of the growth are irregular. C for color:  A mixture of colors are present instead of an even brown color. D for diameter:  Melanomas are usually (but not always) greater than 55mm - the size of a pencil eraser. E for evolution:  The spot keeps changing in size, shape, and color.  Please check your skin once per month between visits. You can use a small mirror in front and a large mirror behind you to keep an eye on the back side or your body.   If you see any new or changing  lesions before your next follow-up, please call to schedule a visit.  Please continue daily skin protection including broad spectrum sunscreen SPF 30+ to sun-exposed areas, reapplying every 2 hours as needed when you're outdoors.    If You Need Anything After Your Visit  If you have any questions or concerns for your doctor, please call our main line at [phone] and press option 4 to reach your doctor's medical assistant. If no one answers, please leave a voicemail as directed and we will return your call as soon as possible. Messages left after 4 pm will be answered the following business day.   You may also send [place] a message via [place]. We typically respond to MyChart messages within 1-2 business days.  For prescription refills, please ask your pharmacy to contact our office. Our fax number is [phone].  If you have an urgent issue when the clinic is closed that cannot wait until the next business day, you can page your doctor at the number below.    Please note that while we do our best to be available for urgent issues outside of office hours, we are not available 24/7.   If you have an urgent issue and are unable to reach [place], you may choose to seek medical care at your doctor's office, retail clinic, urgent care center, or emergency room.  If you have a  medical emergency, please immediately call 911 or go to the emergency department.  Pager Numbers  - Dr. Nehemiah Massed: [phone]  - Dr. Laurence Ferrari: [phone]  - Dr. Nicole Kindred: [phone]  In the event of inclement weather, please call our main line at [phone] for an update on the status of any delays or closures.  Dermatology Medication Tips: Please keep the boxes that topical medications come in in order to help keep track of the instructions about where and how to use these. Pharmacies typically print the medication instructions only on the boxes and not directly on the medication tubes.   If your medication is too  expensive, please contact our office at [phone] option 4 or send [place] a message through [place].   We are unable to tell what your co-pay for medications will be in advance as this is different depending on your insurance coverage. However, we may be able to find a substitute medication at lower cost or fill out paperwork to get insurance to cover a needed medication.   If a prior authorization is required to get your medication covered by your insurance company, please allow [place] 1-2 business days to complete this process.  Drug prices often vary depending on where the prescription is filled and some pharmacies may offer cheaper prices.  The website www.goodrx.com contains coupons for medications through different pharmacies. The prices here do not account for what the cost may be with help from insurance (it may be cheaper with your insurance), but the website can give you the price if you did not use any insurance.  - You can print the associated coupon and take it with your prescription to the pharmacy.  - You may also stop by our office during regular business hours and pick up a GoodRx coupon card.  - If you need your prescription sent electronically to a different pharmacy, notify our office through [facility] or by phone at [phone] option 4.     Si Usted Necesita Algo Despus de Su Visita  Tambin puede enviarnos un mensaje a travs de Pharmacist, community. Por lo general respondemos a los mensajes de MyChart en el transcurso de 1 a 2 das hbiles.  Para renovar recetas, por favor pida a su farmacia que se ponga en contacto con nuestra oficina. Harland Dingwall de fax es [place] [phone].  Si tiene un asunto urgente cuando la clnica est cerrada y que no puede esperar hasta el siguiente da hbil, puede llamar/localizar a su doctor(a) al nmero que aparece a continuacin.   Por favor, tenga en cuenta que aunque hacemos todo lo posible para estar disponibles para asuntos urgentes fuera  del horario de [place], no estamos disponibles las 24 horas del da, los 7 das de la [place].   Si tiene un problema urgente y no puede comunicarse con nosotros, puede optar por buscar atencin mdica  en el consultorio de su doctor(a), en una clnica privada, en un centro de atencin urgente o en una sala de emergencias.  Si tiene Engineering geologist, por favor llame inmediatamente al 911 o vaya a la sala de emergencias.  Nmeros de bper  - Dr. Nehemiah Massed: [phone]  - Dra. Moye: [phone]  - Dra. Nicole Kindred: [phone]  En caso de inclemencias del [place], por favor llame a Johnsie Kindred principal al [phone] para una actualizacin sobre el [place] de cualquier retraso o cierre.  Consejos para la medicacin en dermatologa: Por favor, guarde las cajas en las que vienen los medicamentos de uso tpico para ayudarle  a seguir las [employer] dnde y cmo usarlos. Las farmacias generalmente imprimen las instrucciones del medicamento slo en las cajas y no directamente en los tubos del [place].   Si su medicamento es muy caro, por favor, pngase en contacto con Zigmund Daniel llamando al [phone] y presione la opcin 4 o envenos un mensaje a travs de Pharmacist, community.   No podemos decirle cul ser su copago por los medicamentos por adelantado ya que esto es diferente dependiendo de la cobertura de su seguro. Sin embargo, es posible que podamos encontrar un medicamento sustituto a Electrical engineer un formulario para que el seguro cubra el medicamento que se considera necesario.   Si se requiere una autorizacin previa para que su compaa de seguros [place] su medicamento, por favor permtanos de 1 a 2 das hbiles para completar este proceso.  Los precios de los medicamentos varan con frecuencia dependiendo del Environmental consultant de dnde se surte la receta y alguna farmacias pueden ofrecer precios ms baratos.  El sitio web www.goodrx.com tiene cupones para medicamentos de Office manager. Los precios aqu no tienen en cuenta lo que podra costar con la ayuda del seguro (puede ser ms barato con su seguro), pero el sitio web puede darle el precio si no utiliz Research scientist (physical sciences).  - Puede imprimir el cupn correspondiente y llevarlo con su receta a la farmacia.  - Tambin puede pasar por nuestra oficina durante el horario de atencin regular y Charity fundraiser una tarjeta de cupones de GoodRx.  - Si necesita que su receta se enve electrnicamente a una farmacia diferente, informe a nuestra oficina a travs de MyChart de [place] o por telfono llamando al [phone] y presione la opcin 4.

## 2022-02-15 NOTE — Progress Notes (Signed)
Follow-Up Visit   Subjective  Cynthia [name] is a 41 y.o. female who presents for the following: FBSE (Patient here for full body skin exam and skin cancer screening. Patient with hx of dysplastic nevi and melanoma in situ. Patient does have a red, peeling area at lower lip as well as a spot inside left nostril that scabs but she picks it off. ).   The following portions of the chart were reviewed this encounter and updated as appropriate:   Tobacco   Allergies   Meds   Problems   Med Hx   Surg Hx   Fam Hx       Review of Systems:  No other skin or systemic complaints except as noted in HPI or Assessment and Plan.  Objective  Well appearing patient in no apparent distress; mood and affect are within normal limits.  A full examination was performed including scalp, head, eyes, ears, nose, lips, neck, chest, axillae, abdomen, back, buttocks, bilateral upper extremities, bilateral lower extremities, hands, feet, fingers, toes, fingernails, and toenails. All findings within normal limits unless otherwise noted below.  Lips Erythematous scaly thin plaque mucosal and cutaneous lower lip  left nare Open wound    Assessment & Plan  Cheilitis Lips  Start HC 2.5% cream twice daily for up to 2 weeks to affected areas at lips.   Continue aquaphor lip balm as needed.  Call if rash recurs or fails to clear  hydrocortisone 2.5 % cream - Lips Apply twice daily for up to 2 weeks to affected area at lower lip.  Open wound left nare  Recommend mupirocin to affected area at inside of nose three times daily until clear   Lentigines - Scattered tan macules - Due to sun exposure - Benign-appearing, observe - Recommend daily broad spectrum sunscreen SPF 30+ to sun-exposed areas, reapply every 2 hours as needed. - Call for any changes  Seborrheic Keratoses - Stuck-on, waxy, tan-brown papules and/or plaques  - Benign-appearing - Discussed benign etiology and prognosis. - Observe -  Call for any changes  Melanocytic Nevi - Tan-brown and/or pink-flesh-colored symmetric macules and papules - Benign appearing on exam today - Observation - Call clinic for new or changing moles - Recommend daily use of broad spectrum spf 30+ sunscreen to sun-exposed areas.   Hemangiomas - Red papules - Discussed benign nature - Observe - Call for any changes  Actinic Damage - Chronic condition, secondary to cumulative UV/sun exposure - diffuse scaly erythematous macules with underlying dyspigmentation - Recommend daily broad spectrum sunscreen SPF 30+ to sun-exposed areas, reapply every 2 hours as needed.  - Staying in the shade or wearing long sleeves, sun glasses (UVA+UVB protection) and wide brim hats (4-inch brim around the entire circumference of the hat) are also recommended for sun protection.  - Call for new or changing lesions.  Dermatofibroma - Firm pink/brown papulenodule with dimple sign at right pretibia - Benign appearing - Call for any changes  Skin cancer screening performed today.  History of Dysplastic Nevi - No evidence of recurrence today - Recommend regular full body skin exams - Recommend daily broad spectrum sunscreen SPF 30+ to sun-exposed areas, reapply every 2 hours as needed.  - Call if any new or changing lesions are noted between office visits  History of Melanoma in Situ - No evidence of recurrence today - Recommend regular full body skin exams - Recommend daily broad spectrum sunscreen SPF 30+ to sun-exposed areas, reapply every 2 hours as needed.  -  Call if any new or changing lesions are noted between office visits  Return in about 4 months (around 06/15/2022) for TBSE.  Graciella Belton, RMA, am acting as scribe for Forest Gleason, MD .  Documentation: I have reviewed the above documentation for accuracy and completeness, and I agree with the above.  Forest Gleason, MD

## 2022-03-23 DIAGNOSIS — Z85820 Personal history of malignant melanoma of skin: Secondary | ICD-10-CM | POA: Insufficient documentation

## 2022-06-09 ENCOUNTER — Ambulatory Visit (INDEPENDENT_AMBULATORY_CARE_PROVIDER_SITE_OTHER): Payer: BC Managed Care – PPO | Admitting: Dermatology

## 2022-06-09 ENCOUNTER — Encounter: Payer: Self-pay | Admitting: Dermatology

## 2022-06-09 DIAGNOSIS — L814 Other melanin hyperpigmentation: Secondary | ICD-10-CM

## 2022-06-09 DIAGNOSIS — Z86006 Personal history of melanoma in-situ: Secondary | ICD-10-CM

## 2022-06-09 DIAGNOSIS — D229 Melanocytic nevi, unspecified: Secondary | ICD-10-CM

## 2022-06-09 DIAGNOSIS — L821 Other seborrheic keratosis: Secondary | ICD-10-CM

## 2022-06-09 DIAGNOSIS — D1800 Hemangioma unspecified site: Secondary | ICD-10-CM

## 2022-06-09 DIAGNOSIS — Z1283 Encounter for screening for malignant neoplasm of skin: Secondary | ICD-10-CM

## 2022-06-09 DIAGNOSIS — L578 Other skin changes due to chronic exposure to nonionizing radiation: Secondary | ICD-10-CM

## 2022-06-09 DIAGNOSIS — Z86018 Personal history of other benign neoplasm: Secondary | ICD-10-CM

## 2022-06-09 NOTE — Patient Instructions (Addendum)
Recommend taking Heliocare sun protection supplement daily in sunny weather for additional sun protection. For maximum protection on the sunniest days, you can take up to 2 capsules of regular Heliocare OR take 1 capsule of Heliocare Ultra. For prolonged exposure (such as a full day in the sun), you can repeat your dose of the supplement 4 hours after your first dose. Heliocare can be purchased at [employer], at some [facility] or at [email or web address].   Due to recent changes in healthcare laws, you may see results of your pathology and/or laboratory studies on MyChart before the doctors have had a chance to review them. We understand that in some cases there may be results that are confusing or concerning to you. Please understand that not all results are received at the same time and often the doctors may need to interpret multiple results in order to provide you with the best plan of care or course of treatment. Therefore, we ask that you please give [place] 2 business days to thoroughly review all your results before contacting the office for clarification. Should we see a critical lab result, you will be contacted sooner.  Some Recommended Sunscreens Include:  Body or All Over Sunscreen Blue lizard sensitive Sun bum mineral (avoid if sensitive to scent) Aveeno Positively Mineral Neutrogena sheer zinc (Slightly harder to rub in) [facility] clear zinc (Slightly harder to rub in)  Tinted Face Sunscreen Alastin Hydratint (good for most skin tones, may be slightly dark if you are very fair) Colorescience Sunforgettable Total Protection Face Shield (good for most skin tones) EltaMD UV Physical La Roche Posay Mineral Tinted Cotz Flawless Complexion   Powder Sunscreen (Nice for reapplying or applying on the go) Colorescience Sunforgettable Total Protection Brush on Shield (available in different tints)  Face Sunscreen Available in Different Tints Colorescience Sunforgettable Total Protection Brush  on Shield  bareMinerals Complexion Rescue Tinted Hydrating Gel Cream Broad Spectrum SPF 30 UnSun mineral tinted (comes in medium/dark and light/medium)  Kids (over 6 months) - Mineral Sunscreens Recommended eltaMD stick sunscreen MDsolarSciences KidStick 40 SPF Aveeno Baby Continuous Protection Sensitive Zinc Oxide Blue Lizard Kids mineral based sunscreen lotion Mustela Mineral Sunscreen for face and body Neutrogena Sheer Zinc Kids Sunscreen Stick  Tinted to look like a tan PCAskin sheer tint body spray    If You Need Anything After Your Visit  If you have any questions or concerns for your doctor, please call our main line at [phone] and press option 4 to reach your doctor's medical assistant. If no one answers, please leave a voicemail as directed and we will return your call as soon as possible. Messages left after 4 pm will be answered the following business day.   You may also send [place] a message via [place]. We typically respond to MyChart messages within 1-2 business days.  For prescription refills, please ask your pharmacy to contact our office. Our fax number is [phone].  If you have an urgent issue when the clinic is closed that cannot wait until the next business day, you can page your doctor at the number below.    Please note that while we do our best to be available for urgent issues outside of office hours, we are not available 24/7.   If you have an urgent issue and are unable to reach [place], you may choose to seek medical care at your doctor's office, retail clinic, urgent care center, or emergency room.  If you have a medical emergency, please immediately call 911  or go to the emergency department.  Pager Numbers  - Dr. Nehemiah Massed: [phone]  - Dr. Laurence Ferrari: [phone]  - Dr. Nicole Kindred: [phone]  In the event of inclement weather, please call our main line at [phone] for an update on the status of any delays or closures.  Dermatology Medication  Tips: Please keep the boxes that topical medications come in in order to help keep track of the instructions about where and how to use these. Pharmacies typically print the medication instructions only on the boxes and not directly on the medication tubes.   If your medication is too expensive, please contact our office at [phone] option 4 or send [place] a message through [place].   We are unable to tell what your co-pay for medications will be in advance as this is different depending on your insurance coverage. However, we may be able to find a substitute medication at lower cost or fill out paperwork to get insurance to cover a needed medication.   If a prior authorization is required to get your medication covered by your insurance company, please allow [place] 1-2 business days to complete this process.  Drug prices often vary depending on where the prescription is filled and some pharmacies may offer cheaper prices.  The website www.goodrx.com contains coupons for medications through different pharmacies. The prices here do not account for what the cost may be with help from insurance (it may be cheaper with your insurance), but the website can give you the price if you did not use any insurance.  - You can print the associated coupon and take it with your prescription to the pharmacy.  - You may also stop by our office during regular business hours and pick up a GoodRx coupon card.  - If you need your prescription sent electronically to a different pharmacy, notify our office through [facility] or by phone at [phone] option 4.     Si Usted Necesita Algo Despus de Su Visita  Tambin puede enviarnos un mensaje a travs de Pharmacist, community. Por lo general respondemos a los mensajes de MyChart en el transcurso de 1 a 2 das hbiles.  Para renovar recetas, por favor pida a su farmacia que se ponga en contacto con nuestra oficina. Harland Dingwall de fax es [place] [phone].  Si tiene un  asunto urgente cuando la clnica est cerrada y que no puede esperar hasta el siguiente da hbil, puede llamar/localizar a su doctor(a) al nmero que aparece a continuacin.   Por favor, tenga en cuenta que aunque hacemos todo lo posible para estar disponibles para asuntos urgentes fuera del horario de [place], no estamos disponibles las 24 horas del da, los 7 das de la [place].   Si tiene un problema urgente y no puede comunicarse con nosotros, puede optar por buscar atencin mdica  en el consultorio de su doctor(a), en una clnica privada, en un centro de atencin urgente o en una sala de emergencias.  Si tiene Engineering geologist, por favor llame inmediatamente al 911 o vaya a la sala de emergencias.  Nmeros de bper  - Dr. Nehemiah Massed: [phone]  - Dra. Moye: [phone]  - Dra. Nicole Kindred: [phone]  En caso de inclemencias del [place], por favor llame a Johnsie Kindred principal al [phone] para una actualizacin sobre el [place] de cualquier retraso o cierre.  Consejos para la medicacin en dermatologa: Por favor, guarde las cajas en las que vienen los medicamentos de uso tpico para ayudarle a seguir las instrucciones sobre dnde  y cmo usarlos. Las farmacias generalmente imprimen las instrucciones del medicamento slo en las cajas y no directamente en los tubos del [place].   Si su medicamento es muy caro, por favor, pngase en contacto con Zigmund Daniel llamando al [phone] y presione la opcin 4 o envenos un mensaje a travs de Pharmacist, community.   No podemos decirle cul ser su copago por los medicamentos por adelantado ya que esto es diferente dependiendo de la cobertura de su seguro. Sin embargo, es posible que podamos encontrar un medicamento sustituto a Electrical engineer un formulario para que el seguro cubra el medicamento que se considera necesario.   Si se requiere una autorizacin previa para que su compaa de seguros [place] su medicamento, por favor  permtanos de 1 a 2 das hbiles para completar este proceso.  Los precios de los medicamentos varan con frecuencia dependiendo del Environmental consultant de dnde se surte la receta y alguna farmacias pueden ofrecer precios ms baratos.  El sitio web www.goodrx.com tiene cupones para medicamentos de Airline pilot. Los precios aqu no tienen en cuenta lo que podra costar con la ayuda del seguro (puede ser ms barato con su seguro), pero el sitio web puede darle el precio si no utiliz Research scientist (physical sciences).  - Puede imprimir el cupn correspondiente y llevarlo con su receta a la farmacia.  - Tambin puede pasar por nuestra oficina durante el horario de atencin regular y Charity fundraiser una tarjeta de cupones de GoodRx.  - Si necesita que su receta se enve electrnicamente a una farmacia diferente, informe a nuestra oficina a travs de MyChart de [place] o por telfono llamando al [phone] y presione la opcin 4.

## 2022-06-09 NOTE — Progress Notes (Signed)
   Follow-Up Visit   Subjective  Cynthia [name] is a 41 y.o. female who presents for the following: Annual Exam (Hx dysplastic nevus and MMIS - patient has noticed no new or changing moles, lesions, or spots and is her for a skin cancer screening). The patient presents for Total-Body Skin Exam (TBSE) for skin cancer screening and mole check.  The patient has spots, moles and lesions to be evaluated, some may be new or changing.  The following portions of the chart were reviewed this encounter and updated as appropriate:   Tobacco  Allergies  Meds  Problems  Med Hx  Surg Hx  Fam Hx      Review of Systems:  No other skin or systemic complaints except as noted in HPI or Assessment and Plan.  Objective  Well appearing patient in no apparent distress; mood and affect are within normal limits.  A full examination was performed including scalp, head, eyes, ears, nose, lips, neck, chest, axillae, abdomen, back, buttocks, bilateral upper extremities, bilateral lower extremities, hands, feet, fingers, toes, fingernails, and toenails. All findings within normal limits unless otherwise noted below.   Assessment & Plan   Lentigines - Scattered tan macules - Due to sun exposure - Benign-appearing, observe - Recommend daily broad spectrum sunscreen SPF 30+ to sun-exposed areas, reapply every 2 hours as needed. - Call for any changes  Seborrheic Keratoses - Stuck-on, waxy, tan-brown papules and/or plaques  - Benign-appearing - Discussed benign etiology and prognosis. - Observe - Call for any changes  Melanocytic Nevi - Tan-brown and/or pink-flesh-colored symmetric macules and papules - Benign appearing on exam today - Observation - Call clinic for new or changing moles - Recommend daily use of broad spectrum spf 30+ sunscreen to sun-exposed areas.   Hemangiomas - Red papules - Discussed benign nature - Observe - Call for any changes  Actinic Damage - Chronic condition,  secondary to cumulative UV/sun exposure - diffuse scaly erythematous macules with underlying dyspigmentation - Recommend daily broad spectrum sunscreen SPF 30+ to sun-exposed areas, reapply every 2 hours as needed.  - Staying in the shade or wearing long sleeves, sun glasses (UVA+UVB protection) and wide brim hats (4-inch brim around the entire circumference of the hat) are also recommended for sun protection.  - Call for new or changing lesions.  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  - Call if any new or changing lesions are noted between office visits  Skin cancer screening performed today.  Return in about 6 months (around 12/09/2022) for TBSE.  Luther Redo, CMA, am acting as scribe for Forest Gleason, MD .  Documentation: I have reviewed the above documentation for accuracy and completeness, and I agree with the above.  Forest Gleason, MD

## 2022-06-16 ENCOUNTER — Ambulatory Visit: Payer: BC Managed Care – PPO | Admitting: Dermatology

## 2022-11-22 ENCOUNTER — Ambulatory Visit (INDEPENDENT_AMBULATORY_CARE_PROVIDER_SITE_OTHER): Payer: BC Managed Care – PPO | Admitting: Dermatology

## 2022-11-22 DIAGNOSIS — Z1283 Encounter for screening for malignant neoplasm of skin: Secondary | ICD-10-CM

## 2022-11-22 DIAGNOSIS — D229 Melanocytic nevi, unspecified: Secondary | ICD-10-CM

## 2022-11-22 DIAGNOSIS — L578 Other skin changes due to chronic exposure to nonionizing radiation: Secondary | ICD-10-CM

## 2022-11-22 DIAGNOSIS — L821 Other seborrheic keratosis: Secondary | ICD-10-CM

## 2022-11-22 DIAGNOSIS — Z86018 Personal history of other benign neoplasm: Secondary | ICD-10-CM | POA: Diagnosis not present

## 2022-11-22 DIAGNOSIS — L988 Other specified disorders of the skin and subcutaneous tissue: Secondary | ICD-10-CM

## 2022-11-22 DIAGNOSIS — L814 Other melanin hyperpigmentation: Secondary | ICD-10-CM

## 2022-11-22 DIAGNOSIS — Z86006 Personal history of melanoma in-situ: Secondary | ICD-10-CM | POA: Diagnosis not present

## 2022-11-22 NOTE — Progress Notes (Unsigned)
Follow-Up Visit   Subjective  Cynthia [name] is a 41 y.o. female who presents for the following: FBSE (Hx MMis, dysplastic nevi).  The patient presents for Total-Body Skin Exam (TBSE) for skin cancer screening and mole check.  The patient has spots, moles and lesions to be evaluated, some may be new or changing and the patient has concerns that these could be cancer.  The following portions of the chart were reviewed this encounter and updated as appropriate:   Tobacco  Allergies  Meds  Problems  Med Hx  Surg Hx  Fam Hx      Review of Systems:  No other skin or systemic complaints except as noted in HPI or Assessment and Plan.  Objective  Well appearing patient in no apparent distress; mood and affect are within normal limits.  A full examination was performed including scalp, head, eyes, ears, nose, lips, neck, chest, axillae, abdomen, back, buttocks, bilateral upper extremities, bilateral lower extremities, hands, feet, fingers, toes, fingernails, and toenails. All findings within normal limits unless otherwise noted below.  face Rhytides and volume loss.     Assessment & Plan  Elastosis of skin face  Will prescribe Skin Medicinals Anti-Aging Tretinoin 0.025%/Niacinamide/Vitamin C/Vitamin E/Turmeric/Resveratrol with Hyaluronic Acid. Apply pea sized amount nightly to the entire face.  The patient was advised this is not covered by insurance since it is made by a compounding pharmacy. They will receive an email to check out and the medication will be mailed to their home.   Topical retinoid medications like tretinoin can cause dryness and irritation when first started. Only apply a pea-sized amount to the entire affected area. Avoid applying it around the eyes, edges of mouth and creases at the nose. If you experience irritation, use a good moisturizer first and/or apply the medicine less often. If you are doing well with the medicine, you can increase how often you use it  until you are applying every night. Be careful with sun protection while using this medication as it can make you sensitive to the sun. This medicine should not be used by pregnant women.     History of Dysplastic Nevi upper back L of midline, severe, excised 03/23/21  - No evidence of recurrence today - Recommend regular full body skin exams - Recommend daily broad spectrum sunscreen SPF 30+ to sun-exposed areas, reapply every 2 hours as needed.  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  - Call if any new or changing lesions are noted between office visits  Lentigines - Scattered tan macules - Due to sun exposure - Benign-appearing, observe - Recommend daily broad spectrum sunscreen SPF 30+ to sun-exposed areas, reapply every 2 hours as needed. - Call for any changes  Seborrheic Keratoses - Stuck-on, waxy, tan-brown papules and/or plaques  - Benign-appearing - Discussed benign etiology and prognosis. - Observe - Call for any changes  Melanocytic Nevi - Tan-brown and/or pink-flesh-colored symmetric macules and papules - Benign appearing on exam today - Observation - Call clinic for new or changing moles - Recommend daily use of broad spectrum spf 30+ sunscreen to sun-exposed areas.   Hemangiomas - Red papules - Discussed benign nature - Observe -  Call for any changes  Actinic Damage - Chronic condition, secondary to cumulative UV/sun exposure - diffuse scaly erythematous macules with underlying dyspigmentation - Recommend daily broad spectrum sunscreen SPF 30+ to sun-exposed areas, reapply every 2 hours as needed.  - Staying in the shade or wearing long sleeves, sun glasses  (UVA+UVB protection) and wide brim hats (4-inch brim around the entire circumference of the hat) are also recommended for sun protection.  - Call for new or changing lesions.  Skin cancer screening performed today.  Return in about 6 months (around 05/24/2023) for TBSE, Hx MMis, Hx Dysplastic Nevi.  Graciella Belton, RMA, am acting as scribe for Forest Gleason, MD .  Documentation: I have reviewed the above documentation for accuracy and completeness, and I agree with the above.  Forest Gleason, MD

## 2022-11-22 NOTE — Patient Instructions (Addendum)
Instructions for [facility] Medications  One or more of your medications was sent to the [facility] mail order compounding pharmacy. You will receive an email from them and can purchase the medicine through that link. It will then be mailed to your home at the address you confirmed. If for any reason you do not receive an email from them, please check your spam folder. If you still do not find the email, please let us know. [facility] phone number is [phone].  Will prescribe [facility] Anti-Aging Tretinoin 0.025%/Niacinamide/Vitamin C/Vitamin E/Turmeric/Resveratrol with Hyaluronic Acid. Apply pea sized amount nightly to the entire face.  The patient was advised this is not covered by insurance since it is made by a compounding pharmacy. They will receive an email to check out and the medication will be mailed to their home.   Topical retinoid medications like tretinoin can cause dryness and irritation when first started. Only apply a pea-sized amount to the entire affected area. Avoid applying it around the eyes, edges of mouth and creases at the nose. If you experience irritation, use a good moisturizer first and/or apply the medicine less often. If you are doing well with the medicine, you can increase how often you use it until you are applying every night. Be careful with sun protection while using this medication as it can make you sensitive to the sun. This medicine should not be used by pregnant women.    Recommend taking Heliocare sun protection supplement daily in sunny weather for additional sun protection. For maximum protection on the sunniest days, you can take up to 2 capsules of regular Heliocare OR take 1 capsule of Heliocare Ultra. For prolonged exposure (such as a full day in the sun), you can repeat your dose of the supplement 4 hours after your first dose. Heliocare can be purchased at [employer], at some [facility] or at [email or web address].    Melanoma  ABCDEs  Melanoma is the most dangerous type of skin cancer, and is the leading cause of death from skin disease.  You are more likely to develop melanoma if you: Have light-colored skin, light-colored eyes, or red or blond hair Spend a lot of time in the sun Tan regularly, either outdoors or in a tanning bed Have had blistering sunburns, especially during childhood Have a close family member who has had a melanoma Have atypical moles or large birthmarks  Early detection of melanoma is key since treatment is typically straightforward and cure rates are extremely high if we catch it early.   The first sign of melanoma is often a change in a mole or a new dark spot.  The ABCDE system is a way of remembering the signs of melanoma.  A for asymmetry:  The two halves do not match. B for border:  The edges of the growth are irregular. C for color:  A mixture of colors are present instead of an even brown color. D for diameter:  Melanomas are usually (but not always) greater than 79m - the size of a pencil eraser. E for evolution:  The spot keeps changing in size, shape, and color.  Please check your skin once per month between visits. You can use a small mirror in front and a large mirror behind you to keep an eye on the back side or your body.   If you see any new or changing lesions before your next follow-up, please call to schedule a visit.  Please continue daily skin protection including  broad spectrum sunscreen SPF 30+ to sun-exposed areas, reapplying every 2 hours as needed when you're outdoors.    Due to recent changes in healthcare laws, you may see results of your pathology and/or laboratory studies on MyChart before the doctors have had a chance to review them. We understand that in some cases there may be results that are confusing or concerning to you. Please understand that not all results are received at the same time and often the doctors may need to interpret multiple results in  order to provide you with the best plan of care or course of treatment. Therefore, we ask that you please give [place] 2 business days to thoroughly review all your results before contacting the office for clarification. Should we see a critical lab result, you will be contacted sooner.   If You Need Anything After Your Visit  If you have any questions or concerns for your doctor, please call our main line at [phone] and press option 4 to reach your doctor's medical assistant. If no one answers, please leave a voicemail as directed and we will return your call as soon as possible. Messages left after 4 pm will be answered the following business day.   You may also send [place] a message via [place]. We typically respond to MyChart messages within 1-2 business days.  For prescription refills, please ask your pharmacy to contact our office. Our fax number is [phone].  If you have an urgent issue when the clinic is closed that cannot wait until the next business day, you can page your doctor at the number below.    Please note that while we do our best to be available for urgent issues outside of office hours, we are not available 24/7.   If you have an urgent issue and are unable to reach [place], you may choose to seek medical care at your doctor's office, retail clinic, urgent care center, or emergency room.  If you have a medical emergency, please immediately call 911 or go to the emergency department.  Pager Numbers  - Dr. Nehemiah Massed: [phone]  - Dr. Laurence Ferrari: [phone]  - Dr. Nicole Kindred: [phone]  In the event of inclement weather, please call our main line at [phone] for an update on the status of any delays or closures.  Dermatology Medication Tips: Please keep the boxes that topical medications come in in order to help keep track of the instructions about where and how to use these. Pharmacies typically print the medication instructions only on the boxes and not directly on the  medication tubes.   If your medication is too expensive, please contact our office at [phone] option 4 or send [place] a message through [place].   We are unable to tell what your co-pay for medications will be in advance as this is different depending on your insurance coverage. However, we may be able to find a substitute medication at lower cost or fill out paperwork to get insurance to cover a needed medication.   If a prior authorization is required to get your medication covered by your insurance company, please allow [place] 1-2 business days to complete this process.  Drug prices often vary depending on where the prescription is filled and some pharmacies may offer cheaper prices.  The website www.goodrx.com contains coupons for medications through different pharmacies. The prices here do not account for what the cost may be with help from insurance (it may be cheaper with your insurance), but the website can give you  the price if you did not use any insurance.  - You can print the associated coupon and take it with your prescription to the pharmacy.  - You may also stop by our office during regular business hours and pick up a GoodRx coupon card.  - If you need your prescription sent electronically to a different pharmacy, notify our office through [facility] or by phone at [phone] option 4.     Si Usted Necesita Algo Despus de Su Visita  Tambin puede enviarnos un mensaje a travs de Pharmacist, community. Por lo general respondemos a los mensajes de MyChart en el transcurso de 1 a 2 das hbiles.  Para renovar recetas, por favor pida a su farmacia que se ponga en contacto con nuestra oficina. Harland Dingwall de fax es [place] [phone].  Si tiene un asunto urgente cuando la clnica est cerrada y que no puede esperar hasta el siguiente da hbil, puede llamar/localizar a su doctor(a) al nmero que aparece a continuacin.   Por favor, tenga en cuenta que aunque hacemos todo lo posible  para estar disponibles para asuntos urgentes fuera del horario de [place], no estamos disponibles las 24 horas del da, los 7 das de la [place].   Si tiene un problema urgente y no puede comunicarse con nosotros, puede optar por buscar atencin mdica  en el consultorio de su doctor(a), en una clnica privada, en un centro de atencin urgente o en una sala de emergencias.  Si tiene Engineering geologist, por favor llame inmediatamente al 911 o vaya a la sala de emergencias.  Nmeros de bper  - Dr. Nehemiah Massed: [phone]  - Dra. Moye: [phone]  - Dra. Nicole Kindred: [phone]  En caso de inclemencias del [place], por favor llame a Johnsie Kindred principal al [phone] para una actualizacin sobre el [place] de cualquier retraso o cierre.  Consejos para la medicacin en dermatologa: Por favor, guarde las cajas en las que vienen los medicamentos de uso tpico para ayudarle a seguir las instrucciones sobre dnde y cmo usarlos. Las farmacias generalmente imprimen las instrucciones del medicamento slo en las cajas y no directamente en los tubos del [place].   Si su medicamento es muy caro, por favor, pngase en contacto con Zigmund Daniel llamando al [phone] y presione la opcin 4 o envenos un mensaje a travs de Pharmacist, community.   No podemos decirle cul ser su copago por los medicamentos por adelantado ya que esto es diferente dependiendo de la cobertura de su seguro. Sin embargo, es posible que podamos encontrar un medicamento sustituto a Electrical engineer un formulario para que el seguro cubra el medicamento que se considera necesario.   Si se requiere una autorizacin previa para que su compaa de seguros [place] su medicamento, por favor permtanos de 1 a 2 das hbiles para completar este proceso.  Los precios de los medicamentos varan con frecuencia dependiendo del Environmental consultant de dnde se surte la receta y alguna farmacias pueden ofrecer precios ms baratos.  El sitio web  www.goodrx.com tiene cupones para medicamentos de Airline pilot. Los precios aqu no tienen en cuenta lo que podra costar con la ayuda del seguro (puede ser ms barato con su seguro), pero el sitio web puede darle el precio si no utiliz Research scientist (physical sciences).  - Puede imprimir el cupn correspondiente y llevarlo con su receta a la farmacia.  - Tambin puede pasar por nuestra oficina durante el horario de atencin regular y Charity fundraiser una tarjeta de cupones de GoodRx.  - Si necesita  que su receta se enve electrnicamente a Chiropodist, informe a nuestra oficina a travs de MyChart de [place] o por telfono llamando al [phone] y presione la opcin 4.

## 2022-11-23 ENCOUNTER — Encounter: Payer: Self-pay | Admitting: Dermatology

## 2022-12-08 ENCOUNTER — Encounter: Payer: BC Managed Care – PPO | Admitting: Dermatology

## 2023-05-24 ENCOUNTER — Encounter: Payer: BC Managed Care – PPO | Admitting: Dermatology

## 2023-07-25 ENCOUNTER — Encounter: Payer: BC Managed Care – PPO | Admitting: Dermatology

## 2023-09-07 ENCOUNTER — Ambulatory Visit (INDEPENDENT_AMBULATORY_CARE_PROVIDER_SITE_OTHER): Payer: BC Managed Care – PPO | Admitting: Dermatology

## 2023-09-07 ENCOUNTER — Encounter: Payer: Self-pay | Admitting: Dermatology

## 2023-09-07 DIAGNOSIS — W908XXA Exposure to other nonionizing radiation, initial encounter: Secondary | ICD-10-CM | POA: Diagnosis not present

## 2023-09-07 DIAGNOSIS — R1013 Epigastric pain: Secondary | ICD-10-CM | POA: Insufficient documentation

## 2023-09-07 DIAGNOSIS — L309 Dermatitis, unspecified: Secondary | ICD-10-CM

## 2023-09-07 DIAGNOSIS — E785 Hyperlipidemia, unspecified: Secondary | ICD-10-CM | POA: Insufficient documentation

## 2023-09-07 DIAGNOSIS — Z1283 Encounter for screening for malignant neoplasm of skin: Secondary | ICD-10-CM

## 2023-09-07 DIAGNOSIS — L578 Other skin changes due to chronic exposure to nonionizing radiation: Secondary | ICD-10-CM | POA: Diagnosis not present

## 2023-09-07 DIAGNOSIS — Z86006 Personal history of melanoma in-situ: Secondary | ICD-10-CM

## 2023-09-07 DIAGNOSIS — Z86018 Personal history of other benign neoplasm: Secondary | ICD-10-CM

## 2023-09-07 DIAGNOSIS — D229 Melanocytic nevi, unspecified: Secondary | ICD-10-CM

## 2023-09-07 DIAGNOSIS — L821 Other seborrheic keratosis: Secondary | ICD-10-CM

## 2023-09-07 DIAGNOSIS — L814 Other melanin hyperpigmentation: Secondary | ICD-10-CM | POA: Diagnosis not present

## 2023-09-07 DIAGNOSIS — D1801 Hemangioma of skin and subcutaneous tissue: Secondary | ICD-10-CM

## 2023-09-07 NOTE — Patient Instructions (Signed)

## 2023-09-07 NOTE — Progress Notes (Signed)
   Follow-Up Visit   Subjective  Elbony A Ealy is a 42 y.o. female who presents for the following: Skin Cancer Screening and Full Body Skin Exam  The patient presents for Total-Body Skin Exam (TBSE) for skin cancer screening and mole check. The patient has spots, moles and lesions to be evaluated, some may be new or changing and the patient may have concern these could be cancer.  Patient with hx of dysplastic nevus and MMis.  The following portions of the chart were reviewed this encounter and updated as appropriate: medications, allergies, medical history  Review of Systems:  No other skin or systemic complaints except as noted in HPI or Assessment and Plan.  Objective  Well appearing patient in no apparent distress; mood and affect are within normal limits.  A full examination was performed including scalp, head, eyes, ears, nose, lips, neck, chest, axillae, abdomen, back, buttocks, bilateral upper extremities, bilateral lower extremities, hands, feet, fingers, toes, fingernails, and toenails. All findings within normal limits unless otherwise noted below.   Relevant physical exam findings are noted in the Assessment and Plan. Exam of nails limited by presence of nail polish. Exam of face limited by presence of make-up.   Assessment & Plan   SKIN CANCER SCREENING PERFORMED TODAY.  ACTINIC DAMAGE - Chronic condition, secondary to cumulative UV/sun exposure - diffuse scaly erythematous macules with underlying dyspigmentation - Recommend daily broad spectrum sunscreen SPF 30+ to sun-exposed areas, reapply every 2 hours as needed.  - Staying in the shade or wearing long sleeves, sun glasses (UVA+UVB protection) and wide brim hats (4-inch brim around the entire circumference of the hat) are also recommended for sun protection.  - Call for new or changing lesions.  LENTIGINES, SEBORRHEIC KERATOSES, HEMANGIOMAS - Benign normal skin lesions - Benign-appearing - Call for any  changes  MELANOCYTIC NEVI - Tan-brown and/or pink-flesh-colored symmetric macules and papules - Benign appearing on exam today - Observation - Call clinic for new or changing moles - Recommend daily use of broad spectrum spf 30+ sunscreen to sun-exposed areas.   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  - Call if any new or changing lesions are noted between office visits  HISTORY OF MELANOMA IN SITU - No evidence of recurrence today at L upper arm - arising in a dysplastic nevus - WLE 02/23/2021  - Recommend regular full body skin exams - Recommend daily broad spectrum sunscreen SPF 30+ to sun-exposed areas, reapply every 2 hours as needed.  - Call if any new or changing lesions are noted between office visits  Ring dermatitis Exam: erythematous scaly patch on left 4th finger under ring  Plan: Discussed possibility of nickel allergy given ring is white gold Patient reports mupirocin clears rash. Continue PRN   Return in about 6 months (around 03/06/2024) for TBSE, with Dr. Katrinka Blazing, Hx MMis, Hx Dysplastic Nevi.  Anise Salvo, RMA, am acting as scribe for Elie Goody, MD .   Documentation: I have reviewed the above documentation for accuracy and completeness, and I agree with the above.  Elie Goody, MD

## 2024-03-07 ENCOUNTER — Encounter: Payer: Self-pay | Admitting: Dermatology

## 2024-03-07 ENCOUNTER — Ambulatory Visit: Payer: BC Managed Care – PPO | Admitting: Dermatology

## 2024-03-07 DIAGNOSIS — L814 Other melanin hyperpigmentation: Secondary | ICD-10-CM

## 2024-03-07 DIAGNOSIS — Z86018 Personal history of other benign neoplasm: Secondary | ICD-10-CM

## 2024-03-07 DIAGNOSIS — D239 Other benign neoplasm of skin, unspecified: Secondary | ICD-10-CM

## 2024-03-07 DIAGNOSIS — W908XXA Exposure to other nonionizing radiation, initial encounter: Secondary | ICD-10-CM

## 2024-03-07 DIAGNOSIS — L578 Other skin changes due to chronic exposure to nonionizing radiation: Secondary | ICD-10-CM | POA: Diagnosis not present

## 2024-03-07 DIAGNOSIS — L821 Other seborrheic keratosis: Secondary | ICD-10-CM | POA: Diagnosis not present

## 2024-03-07 DIAGNOSIS — D2371 Other benign neoplasm of skin of right lower limb, including hip: Secondary | ICD-10-CM

## 2024-03-07 DIAGNOSIS — Z1283 Encounter for screening for malignant neoplasm of skin: Secondary | ICD-10-CM

## 2024-03-07 DIAGNOSIS — D1801 Hemangioma of skin and subcutaneous tissue: Secondary | ICD-10-CM

## 2024-03-07 DIAGNOSIS — D229 Melanocytic nevi, unspecified: Secondary | ICD-10-CM

## 2024-03-07 DIAGNOSIS — Z86006 Personal history of melanoma in-situ: Secondary | ICD-10-CM

## 2024-03-07 NOTE — Patient Instructions (Addendum)

## 2024-03-07 NOTE — Progress Notes (Signed)
 Follow-Up Visit   Subjective  Cynthia [name] is a 43 y.o. female who presents for the following: Skin Cancer Screening and Full Body Skin Exam hx of Melanoma IS, Dysplastic Nevus  The patient presents for Total-Body Skin Exam (TBSE) for skin cancer screening and mole check. The patient has spots, moles and lesions to be evaluated, some may be new or changing and the patient may have concern these could be cancer.    The following portions of the chart were reviewed this encounter and updated as appropriate: medications, allergies, medical history  Review of Systems:  No other skin or systemic complaints except as noted in HPI or Assessment and Plan.  Objective  Well appearing patient in no apparent distress; mood and affect are within normal limits.  A full examination was performed including scalp, head, eyes, ears, nose, lips, neck, chest, axillae, abdomen, back, buttocks, bilateral upper extremities, bilateral lower extremities, hands, feet, fingers, toes, fingernails, and toenails. All findings within normal limits unless otherwise noted below.   Relevant physical exam findings are noted in the Assessment and Plan.    Assessment & Plan   SKIN CANCER SCREENING PERFORMED TODAY.  ACTINIC DAMAGE - Chronic condition, secondary to cumulative UV/sun exposure - diffuse scaly erythematous macules with underlying dyspigmentation - Recommend daily broad spectrum sunscreen SPF 30+ to sun-exposed areas, reapply every 2 hours as needed.  - Staying in the shade or wearing long sleeves, sun glasses (UVA+UVB protection) and wide brim hats (4-inch brim around the entire circumference of the hat) are also recommended for sun protection.  - Call for new or changing lesions.  LENTIGINES, SEBORRHEIC KERATOSES, HEMANGIOMAS - Benign normal skin lesions - Benign-appearing - Call for any changes  MELANOCYTIC NEVI - Tan-brown and/or pink-flesh-colored symmetric macules and papules - Benign  appearing on exam today - Observation - Call clinic for new or changing moles - Recommend daily use of broad spectrum spf 30+ sunscreen to sun-exposed areas.   HISTORY OF DYSPLASTIC NEVUS No evidence of recurrence today Recommend regular full body skin exams Recommend daily broad spectrum sunscreen SPF 30+ to sun-exposed areas, reapply every 2 hours as needed.  Call if any new or changing lesions are noted between office visits  - Upper back left of midline  HISTORY OF MELANOMA IN SITU - No evidence of recurrence today - Recommend regular full body skin exams - Recommend daily broad spectrum sunscreen SPF 30+ to sun-exposed areas, reapply every 2 hours as needed.  - Call if any new or changing lesions are noted between office visits  - L upper arm WLE 02/23/2021  DERMATOFIBROMA R lower pretibia Exam: Firm pink/brown papulenodule with dimple sign R lower pretibia. Treatment Plan: A dermatofibroma is a benign growth possibly related to trauma, such as an insect bite, cut from shaving, or inflamed acne-type bump.  Treatment options to remove include shave or excision with resulting scar and risk of recurrence.  Since benign-appearing and not bothersome, will observe for now.   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  I, Ardis Rowan, RMA, am acting as scribe for Elie Goody, MD .   Documentation: I  have reviewed the above documentation for accuracy and completeness, and I agree with the above.  Elie Goody, MD

## 2024-09-09 ENCOUNTER — Ambulatory Visit: Admitting: Dermatology

## 2024-09-12 ENCOUNTER — Encounter: Payer: Self-pay | Admitting: Dermatology

## 2024-09-12 ENCOUNTER — Ambulatory Visit (INDEPENDENT_AMBULATORY_CARE_PROVIDER_SITE_OTHER): Admitting: Dermatology

## 2024-09-12 DIAGNOSIS — Z1283 Encounter for screening for malignant neoplasm of skin: Secondary | ICD-10-CM

## 2024-09-12 DIAGNOSIS — L814 Other melanin hyperpigmentation: Secondary | ICD-10-CM | POA: Diagnosis not present

## 2024-09-12 DIAGNOSIS — Z86006 Personal history of melanoma in-situ: Secondary | ICD-10-CM

## 2024-09-12 DIAGNOSIS — D492 Neoplasm of unspecified behavior of bone, soft tissue, and skin: Secondary | ICD-10-CM

## 2024-09-12 DIAGNOSIS — W908XXA Exposure to other nonionizing radiation, initial encounter: Secondary | ICD-10-CM | POA: Diagnosis not present

## 2024-09-12 DIAGNOSIS — L821 Other seborrheic keratosis: Secondary | ICD-10-CM

## 2024-09-12 DIAGNOSIS — Z86018 Personal history of other benign neoplasm: Secondary | ICD-10-CM

## 2024-09-12 DIAGNOSIS — L578 Other skin changes due to chronic exposure to nonionizing radiation: Secondary | ICD-10-CM | POA: Diagnosis not present

## 2024-09-12 DIAGNOSIS — D1801 Hemangioma of skin and subcutaneous tissue: Secondary | ICD-10-CM

## 2024-09-12 DIAGNOSIS — D229 Melanocytic nevi, unspecified: Secondary | ICD-10-CM

## 2024-09-12 NOTE — Patient Instructions (Addendum)

## 2024-09-12 NOTE — Progress Notes (Signed)
 Follow-Up Visit   Subjective  Cynthia [name] is a 43 y.o. female who presents for the following: Skin Cancer Screening and Full Body Skin Exam, hx of Melanoma IS, Dysplastic Nevi, check moles pubic area, not new just want them checked  The patient presents for Total-Body Skin Exam (TBSE) for skin cancer screening and mole check. The patient has spots, moles and lesions to be evaluated, some may be new or changing and the patient may have concern these could be cancer.    The following portions of the chart were reviewed this encounter and updated as appropriate: medications, allergies, medical history  Review of Systems:  No other skin or systemic complaints except as noted in HPI or Assessment and Plan.  Objective  Well appearing patient in no apparent distress; mood and affect are within normal limits.  A full examination was performed including scalp, head, eyes, ears, nose, lips, neck, chest, axillae, abdomen, back, buttocks, bilateral upper extremities, bilateral lower extremities, hands, feet, fingers, toes, fingernails, and toenails. All findings within normal limits unless otherwise noted below.   Relevant physical exam findings are noted in the Assessment and Plan.  R parasternal chest 6.19mm pigmented macule   Assessment & Plan   SKIN CANCER SCREENING PERFORMED TODAY.  ACTINIC DAMAGE - Chronic condition, secondary to cumulative UV/sun exposure - diffuse scaly erythematous macules with underlying dyspigmentation - Recommend daily broad spectrum sunscreen SPF 30+ to sun-exposed areas, reapply every 2 hours as needed.  - Staying in the shade or wearing long sleeves, sun glasses (UVA+UVB protection) and wide brim hats (4-inch brim around the entire circumference of the hat) are also recommended for sun protection.  - Call for new or changing lesions.  LENTIGINES, SEBORRHEIC KERATOSES, HEMANGIOMAS - Benign normal skin lesions - Benign-appearing - Call for any  changes  MELANOCYTIC NEVI - Tan-brown and/or pink-flesh-colored symmetric macules and papules - Benign appearing on exam today - Observation - Call clinic for new or changing moles - Recommend daily use of broad spectrum spf 30+ sunscreen to sun-exposed areas.   HISTORY OF MELANOMA IN SITU - No evidence of recurrence today - Recommend regular full body skin exams - Recommend daily broad spectrum sunscreen SPF 30+ to sun-exposed areas, reapply every 2 hours as needed.  - Call if any new or changing lesions are noted between office visits  - L upper arm, excised 02/23/21  HISTORY OF DYSPLASTIC NEVUS No evidence of recurrence today Recommend regular full body skin exams Recommend daily broad spectrum sunscreen SPF 30+ to sun-exposed areas, reapply every 2 hours as needed.  Call if any new or changing lesions are noted between office visits  - Upper back L of midline  NEOPLASM OF SKIN R parasternal chest Skin / nail biopsy Type of biopsy: tangential   Informed consent: discussed and consent obtained   Timeout: patient name, date of birth, surgical site, and procedure verified   Procedure prep:  Patient was prepped and draped in usual sterile fashion Prep type:  Isopropyl alcohol Anesthesia: the lesion was anesthetized in a standard fashion   Anesthetic:  1% lidocaine  w/ epinephrine  1-100,000 buffered w/ 8.4% NaHCO3 Instrument used: DermaBlade   Hemostasis achieved with: pressure and aluminum chloride   Outcome: patient tolerated procedure well   Post-procedure details: sterile dressing applied and wound care instructions given   Dressing type: bandage (mupirocin )    Specimen 1 - Surgical pathology Differential Diagnosis: Lentigo vs Lentigo Maligna  Check Margins: No 6.2mm pigmented macule 2 pieces LENTIGINES  MULTIPLE BENIGN NEVI   ACTINIC ELASTOSIS   SEBORRHEIC KERATOSES   CHERRY ANGIOMA   Return for TBSE, Hx of Melanoma IS, Hx of Dysplastic nevi.  I, Grayce Saunas, RMA, am acting as scribe for Boneta Sharps, MD .   Documentation: I have reviewed the above documentation for accuracy and completeness, and I agree with the above.  Boneta Sharps, MD

## 2024-09-17 ENCOUNTER — Ambulatory Visit: Payer: Self-pay | Admitting: Dermatology

## 2024-09-17 LAB — SURGICAL PATHOLOGY

## 2024-09-18 NOTE — Telephone Encounter (Signed)
-----   Message from [place] sent at 09/17/2024  5:57 PM EDT ----- Diagnosis: R parasternal chest :       SOLAR LENTIGO   Plan: none, sent mychart message ----- Message ----- From: Interface, Lab In [facility] One Sent: 09/17/2024   4:21 PM EDT To: Boneta Sharps, MD

## 2024-09-18 NOTE — Telephone Encounter (Signed)
 Advised pt of bx results/sh ?

## 2024-12-20 ENCOUNTER — Other Ambulatory Visit: Payer: Self-pay | Admitting: Obstetrics & Gynecology

## 2024-12-20 DIAGNOSIS — R928 Other abnormal and inconclusive findings on diagnostic imaging of breast: Secondary | ICD-10-CM

## 2025-01-01 ENCOUNTER — Ambulatory Visit
Admission: RE | Admit: 2025-01-01 | Discharge: 2025-01-01 | Disposition: A | Discharge status: Home / Self Care | Source: Ambulatory Visit | Attending: Obstetrics & Gynecology | Admitting: Obstetrics & Gynecology

## 2025-01-01 DIAGNOSIS — R928 Other abnormal and inconclusive findings on diagnostic imaging of breast: Secondary | ICD-10-CM

## 2025-03-20 ENCOUNTER — Ambulatory Visit: Admitting: Dermatology
